# Patient Record
Sex: Female | Born: 1955 | Race: Black or African American | Hispanic: No | Marital: Single | Smoking: Former smoker
Health system: Southern US, Community
[De-identification: ages and names within clinical notes are randomized; demographics above are authoritative.]

## PROBLEM LIST (undated history)

## (undated) ENCOUNTER — Ambulatory Visit: Admission: EM | Payer: Medicare PPO | Source: Home / Self Care

## (undated) DIAGNOSIS — Z8619 Personal history of other infectious and parasitic diseases: Secondary | ICD-10-CM

## (undated) DIAGNOSIS — K219 Gastro-esophageal reflux disease without esophagitis: Secondary | ICD-10-CM

## (undated) DIAGNOSIS — E785 Hyperlipidemia, unspecified: Secondary | ICD-10-CM

## (undated) DIAGNOSIS — R569 Unspecified convulsions: Secondary | ICD-10-CM

## (undated) DIAGNOSIS — I10 Essential (primary) hypertension: Secondary | ICD-10-CM

## (undated) DIAGNOSIS — E0500 Thyrotoxicosis with diffuse goiter without thyrotoxic crisis or storm: Secondary | ICD-10-CM

## (undated) DIAGNOSIS — E11319 Type 2 diabetes mellitus with unspecified diabetic retinopathy without macular edema: Secondary | ICD-10-CM

## (undated) DIAGNOSIS — E119 Type 2 diabetes mellitus without complications: Secondary | ICD-10-CM

## (undated) HISTORY — DX: Type 2 diabetes mellitus without complications: E11.9

## (undated) HISTORY — DX: Personal history of other infectious and parasitic diseases: Z86.19

## (undated) HISTORY — DX: Essential (primary) hypertension: I10

## (undated) HISTORY — DX: Thyrotoxicosis with diffuse goiter without thyrotoxic crisis or storm: E05.00

## (undated) HISTORY — DX: Gastro-esophageal reflux disease without esophagitis: K21.9

## (undated) HISTORY — DX: Type 2 diabetes mellitus with unspecified diabetic retinopathy without macular edema: E11.319

## (undated) HISTORY — PX: BUNIONECTOMY: SHX129

## (undated) HISTORY — DX: Hyperlipidemia, unspecified: E78.5

## (undated) HISTORY — DX: Unspecified convulsions: R56.9

## (undated) HISTORY — PX: OTHER SURGICAL HISTORY: SHX169

---

## 2000-03-14 ENCOUNTER — Other Ambulatory Visit: Admission: RE | Admit: 2000-03-14 | Discharge: 2000-03-14 | Payer: Self-pay | Admitting: Obstetrics & Gynecology

## 2000-12-23 ENCOUNTER — Encounter: Admission: RE | Admit: 2000-12-23 | Discharge: 2001-03-23 | Payer: Self-pay | Admitting: Internal Medicine

## 2001-05-18 ENCOUNTER — Other Ambulatory Visit: Admission: RE | Admit: 2001-05-18 | Discharge: 2001-05-18 | Payer: Self-pay | Admitting: Obstetrics & Gynecology

## 2001-05-22 ENCOUNTER — Encounter: Admission: RE | Admit: 2001-05-22 | Discharge: 2001-05-22 | Payer: Self-pay | Admitting: Gastroenterology

## 2001-05-22 ENCOUNTER — Encounter: Payer: Self-pay | Admitting: Family Medicine

## 2001-11-09 ENCOUNTER — Encounter: Payer: Self-pay | Admitting: Obstetrics & Gynecology

## 2001-11-09 ENCOUNTER — Encounter: Admission: RE | Admit: 2001-11-09 | Discharge: 2001-11-09 | Payer: Self-pay | Admitting: Obstetrics & Gynecology

## 2002-07-01 ENCOUNTER — Encounter: Admission: RE | Admit: 2002-07-01 | Discharge: 2002-07-01 | Payer: Self-pay | Admitting: Obstetrics & Gynecology

## 2002-07-01 ENCOUNTER — Encounter: Payer: Self-pay | Admitting: Obstetrics & Gynecology

## 2002-07-19 ENCOUNTER — Other Ambulatory Visit: Admission: RE | Admit: 2002-07-19 | Discharge: 2002-07-19 | Payer: Self-pay | Admitting: Obstetrics & Gynecology

## 2003-06-07 ENCOUNTER — Other Ambulatory Visit: Admission: RE | Admit: 2003-06-07 | Discharge: 2003-06-07 | Payer: Self-pay | Admitting: Obstetrics & Gynecology

## 2003-06-14 ENCOUNTER — Encounter: Admission: RE | Admit: 2003-06-14 | Discharge: 2003-06-14 | Payer: Self-pay | Admitting: Obstetrics & Gynecology

## 2004-04-06 ENCOUNTER — Other Ambulatory Visit: Admission: RE | Admit: 2004-04-06 | Discharge: 2004-04-06 | Payer: Self-pay | Admitting: Obstetrics & Gynecology

## 2004-07-26 ENCOUNTER — Encounter: Admission: RE | Admit: 2004-07-26 | Discharge: 2004-07-26 | Payer: Self-pay | Admitting: Obstetrics & Gynecology

## 2004-08-07 ENCOUNTER — Encounter: Admission: RE | Admit: 2004-08-07 | Discharge: 2004-08-07 | Payer: Self-pay | Admitting: Obstetrics & Gynecology

## 2005-10-10 ENCOUNTER — Encounter: Admission: RE | Admit: 2005-10-10 | Discharge: 2005-10-10 | Payer: Self-pay | Admitting: Family Medicine

## 2006-05-27 HISTORY — PX: COLONOSCOPY: SHX174

## 2006-07-18 ENCOUNTER — Ambulatory Visit (HOSPITAL_COMMUNITY): Admission: RE | Admit: 2006-07-18 | Discharge: 2006-07-18 | Payer: Self-pay | Admitting: *Deleted

## 2006-10-29 ENCOUNTER — Encounter: Admission: RE | Admit: 2006-10-29 | Discharge: 2006-10-29 | Payer: Self-pay | Admitting: Obstetrics & Gynecology

## 2007-08-16 ENCOUNTER — Emergency Department (HOSPITAL_COMMUNITY): Admission: EM | Admit: 2007-08-16 | Discharge: 2007-08-16 | Payer: Self-pay | Admitting: Emergency Medicine

## 2007-11-30 ENCOUNTER — Encounter: Admission: RE | Admit: 2007-11-30 | Discharge: 2007-11-30 | Payer: Self-pay | Admitting: Obstetrics & Gynecology

## 2009-01-24 ENCOUNTER — Encounter: Admission: RE | Admit: 2009-01-24 | Discharge: 2009-01-24 | Payer: Self-pay | Admitting: Obstetrics & Gynecology

## 2009-03-24 IMAGING — MG MM SCREEN MAMMOGRAM BILATERAL
4 series · 4 of 4 positions shown · non-contrast
Comparison: none

DG SCREEN MAMMOGRAM BILATERAL
Bilateral CC and MLO view(s) were taken.

DIGITAL SCREENING MAMMOGRAM WITH CAD:
There is a  dense fibroglandular pattern.  No masses or malignant type calcifications are 
identified.  Compared with prior studies.

[R CC]
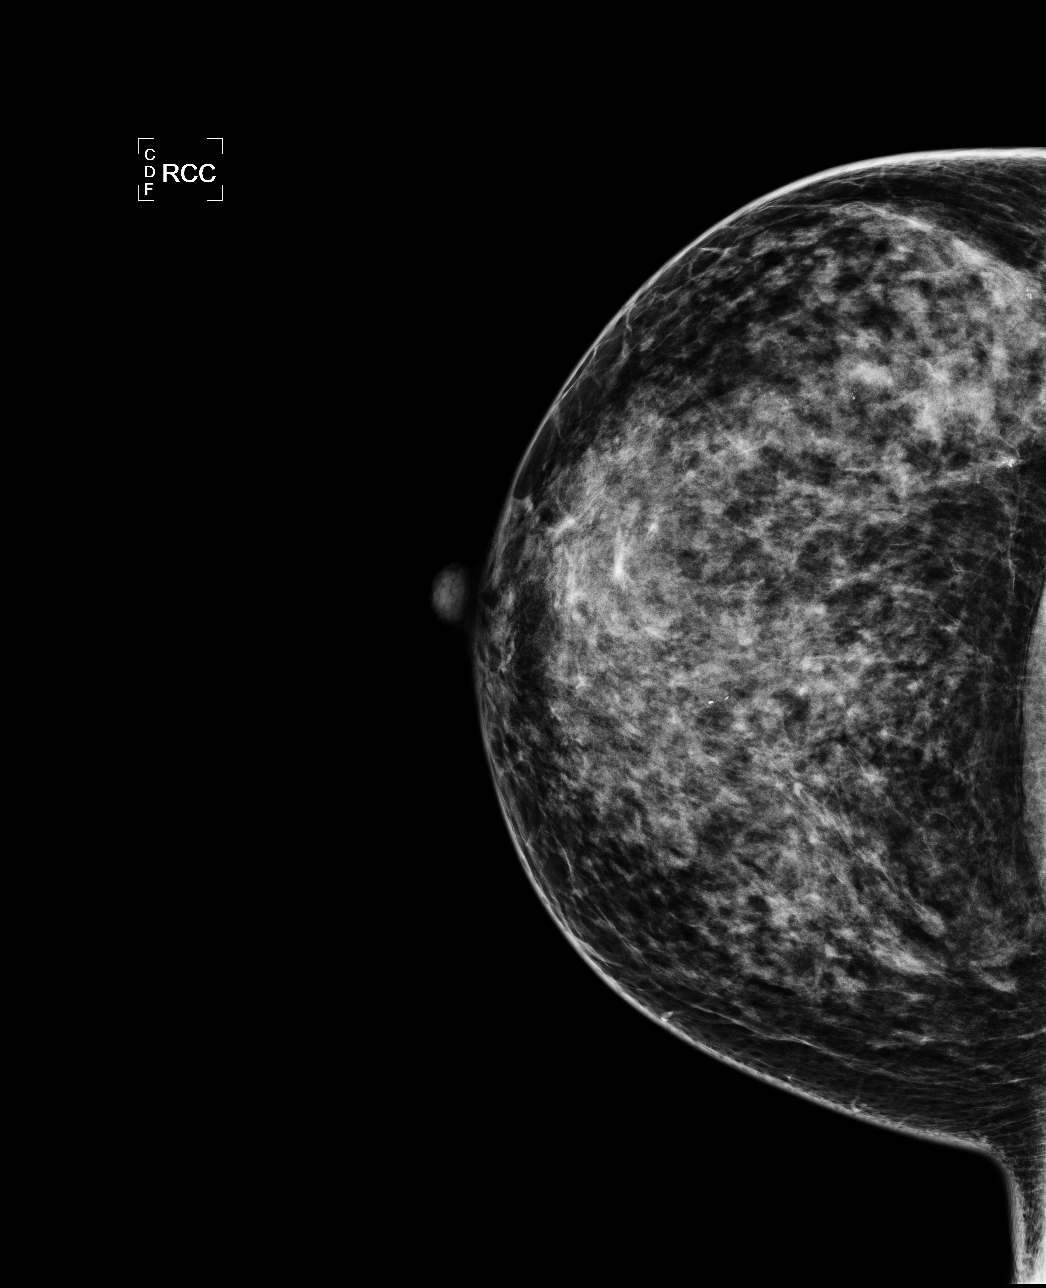

[L CC]
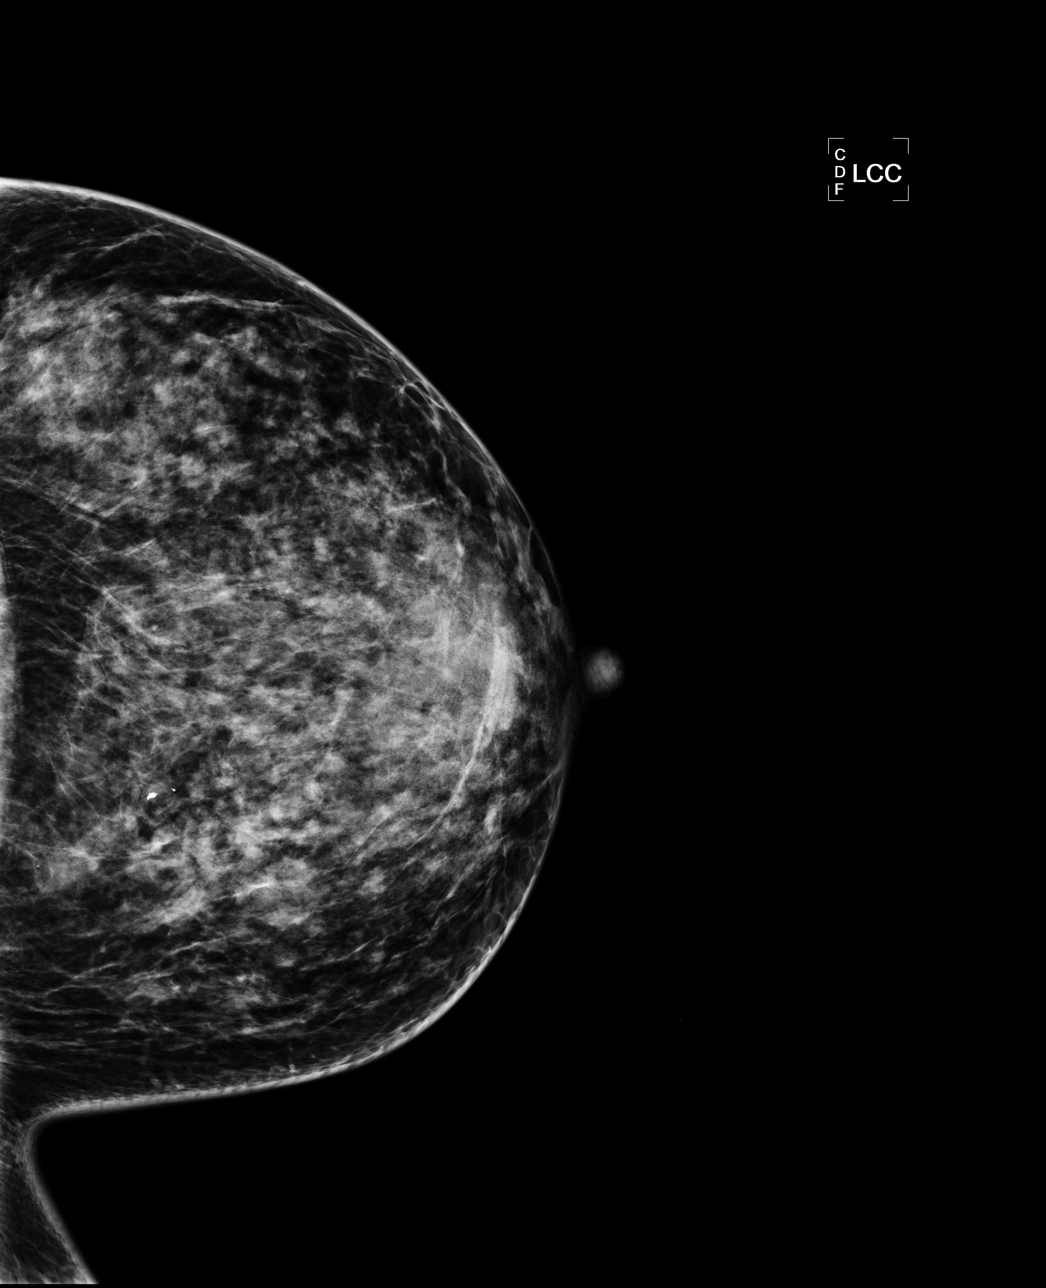

[L MLO]
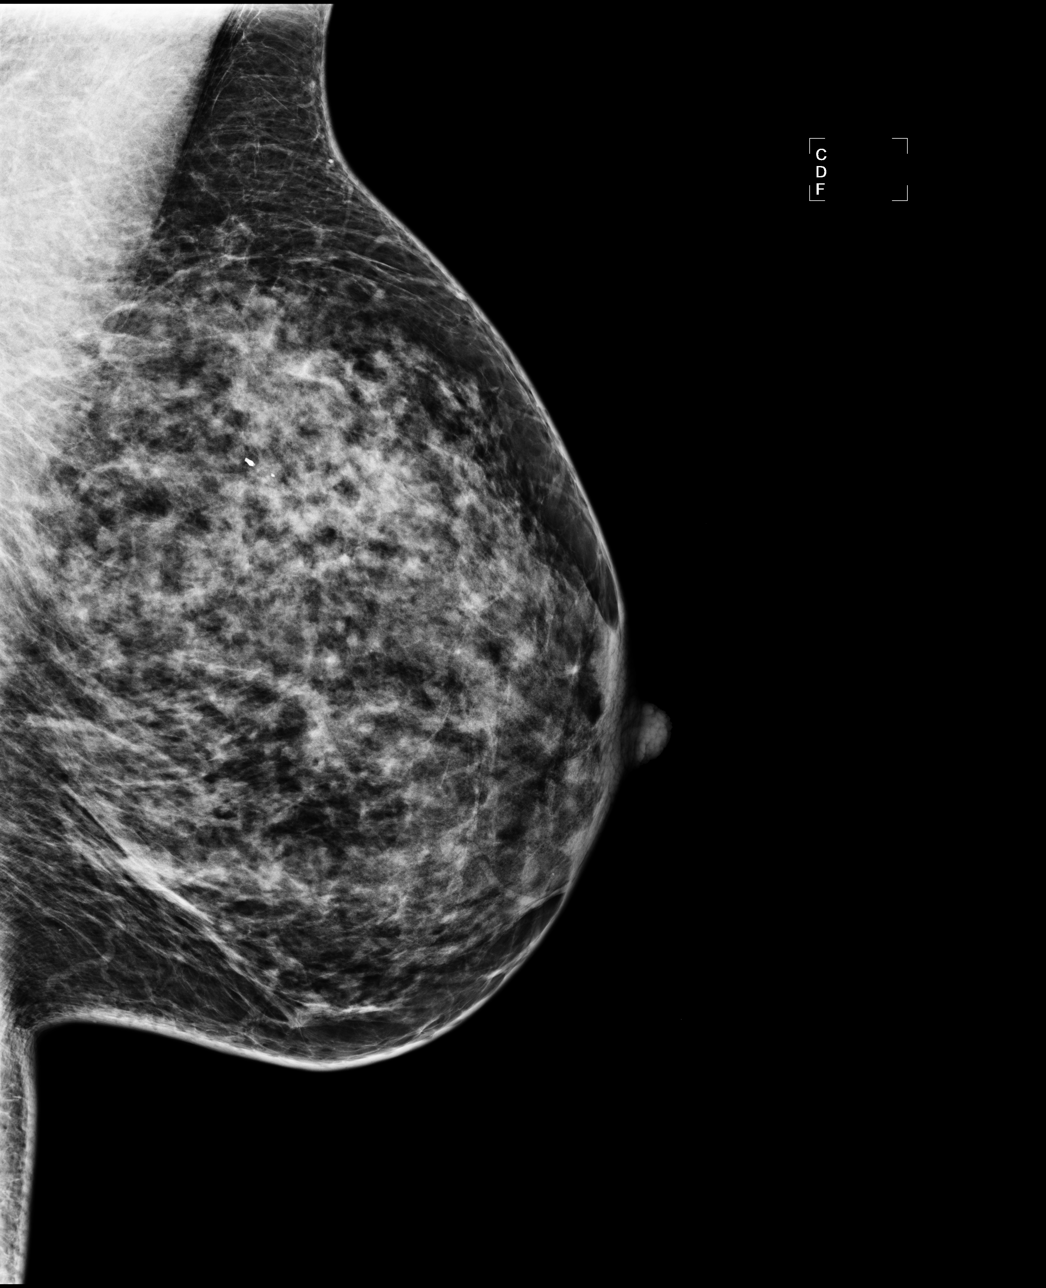

[R MLO]
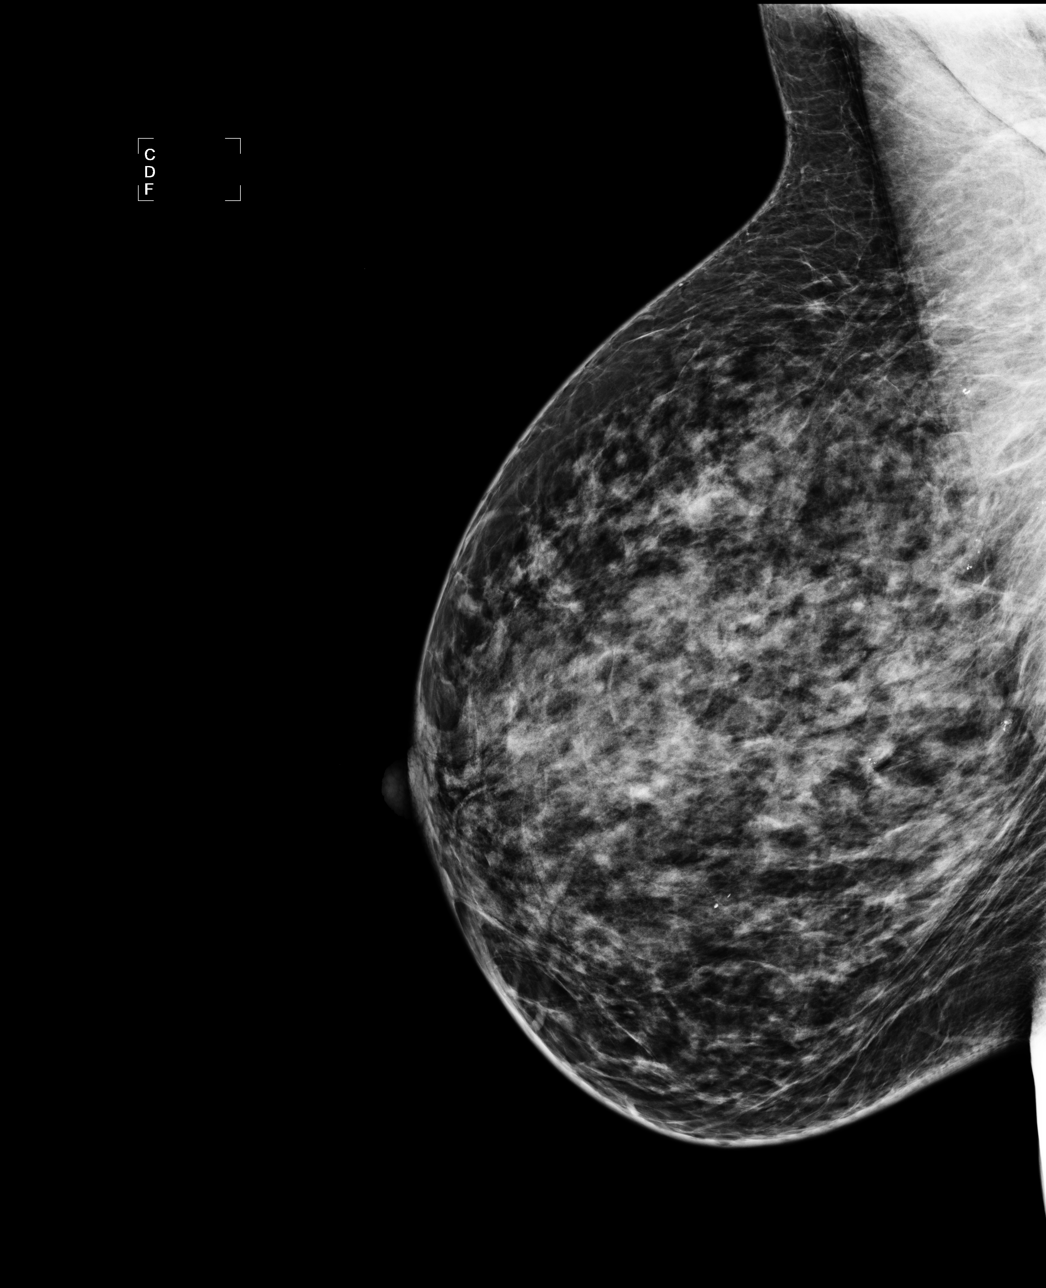

[4 of 4 positions shown; findings below may reference images not displayed]

IMPRESSION: No specific mammographic evidence of malignancy.  Next screening mammogram is recommended in one 
year. Consider biennial screening breast MRI given the patient's high risk status with a strong 
family history of breast cancer.

ASSESSMENT: Negative - BI-RADS 1

Screening mammogram in 1 year.
ANALYZED BY COMPUTER AIDED DETECTION. , THIS PROCEDURE WAS A DIGITAL MAMMOGRAM.

## 2009-09-29 ENCOUNTER — Emergency Department (HOSPITAL_COMMUNITY): Admission: EM | Admit: 2009-09-29 | Discharge: 2009-09-29 | Payer: Self-pay | Admitting: Emergency Medicine

## 2010-03-06 ENCOUNTER — Encounter: Admission: RE | Admit: 2010-03-06 | Discharge: 2010-03-06 | Payer: Self-pay | Admitting: Obstetrics & Gynecology

## 2010-06-17 ENCOUNTER — Encounter: Payer: Self-pay | Admitting: Obstetrics & Gynecology

## 2010-08-14 LAB — DIFFERENTIAL
Basophils Relative: 1 % (ref 0–1)
Lymphocytes Relative: 26 % (ref 12–46)
Monocytes Absolute: 0.6 10*3/uL (ref 0.1–1.0)
Monocytes Relative: 10 % (ref 3–12)
Neutro Abs: 3.9 10*3/uL (ref 1.7–7.7)

## 2010-08-14 LAB — COMPREHENSIVE METABOLIC PANEL
ALT: 22 U/L (ref 0–35)
Albumin: 4 g/dL (ref 3.5–5.2)
Alkaline Phosphatase: 65 U/L (ref 39–117)
BUN: 24 mg/dL — ABNORMAL HIGH (ref 6–23)
Chloride: 107 mEq/L (ref 96–112)
GFR calc Af Amer: >60 mL/min (ref >60)
Glucose, Bld: 69 mg/dL — ABNORMAL LOW (ref 70–99)
Potassium: 3.1 mEq/L — ABNORMAL LOW (ref 3.5–5.1)
Total Bilirubin: 1.1 mg/dL (ref 0.3–1.2)

## 2010-08-14 LAB — URINE MICROSCOPIC-ADD ON

## 2010-08-14 LAB — CBC
MCHC: 32.5 g/dL (ref 30.0–36.0)
MCV: 94.1 fL (ref 78.0–100.0)
Platelets: 171 10*3/uL (ref 150–400)

## 2010-08-14 LAB — URINALYSIS, ROUTINE W REFLEX MICROSCOPIC
Bilirubin Urine: NEGATIVE
Glucose, UA: >1000 mg/dL — AB
Specific Gravity, Urine: 1.029 (ref 1.005–1.030)
pH: 6 (ref 5.0–8.0)

## 2010-08-14 LAB — GLUCOSE, CAPILLARY: Glucose-Capillary: 133 mg/dL — ABNORMAL HIGH (ref 70–99)

## 2010-10-12 NOTE — Op Note (Signed)
NAMEPERCILLA, TWETEN NO.:  [id number]   MEDICAL RECORD NO.:  [id number]          PATIENT TYPE:  AMB   LOCATION:  ENDO                         FACILITY:  [facility]   PHYSICIAN:  Georgiana Spinner, M.D.    DATE OF BIRTH:  06-Nov-1955   DATE OF PROCEDURE:  07/18/2006  DATE OF DISCHARGE:                               OPERATIVE REPORT   PROCEDURE:  Colonoscopy.   INDICATIONS:  Rectal bleeding, colon cancer screening.   ANESTHESIA:  Demerol 70 and Versed 7.5 mg.   PROCEDURE:  With the patient mildly sedated in the left lateral  decubitus position, the Pentax videoscopic colonoscope was inserted in  the rectum and, with pressure applied, we advanced under direct vision  to the cecum identified by ileocecal valve and appendiceal orifice, both  of which were photographed.  From this point the colonoscope was slowly  withdrawn taking circumferential views of the colonic mucosa, stopping  in the rectum which appeared normal on direct and showed hemorrhoids on  retroflexed view.  The endoscope was straightened and withdrawn.  The  patient's vital signs and pulse oximeter remained stable.  The patient  tolerated procedure well without apparent complications.   FINDINGS:  Internal hemorrhoids, otherwise an unremarkable examination.   PLAN:  Consider repeat examination in 5-10 years           ______________________________  Georgiana Spinner, M.D.     GMO/MEDQ  D:  07/18/2006  T:  07/18/2006  Job:  [id number]

## 2010-11-12 ENCOUNTER — Other Ambulatory Visit (HOSPITAL_COMMUNITY)
Admission: RE | Admit: 2010-11-12 | Discharge: 2010-11-12 | Disposition: A | Discharge status: Home / Self Care | Payer: BC Managed Care – PPO | Source: Ambulatory Visit | Attending: Internal Medicine | Admitting: Internal Medicine

## 2010-11-12 DIAGNOSIS — Z01419 Encounter for gynecological examination (general) (routine) without abnormal findings: Secondary | ICD-10-CM | POA: Insufficient documentation

## 2011-02-18 LAB — URINALYSIS, ROUTINE W REFLEX MICROSCOPIC
Bilirubin Urine: NEGATIVE
Glucose, UA: >1000 — AB
Hgb urine dipstick: NEGATIVE
Ketones, ur: NEGATIVE
Protein, ur: NEGATIVE
Urobilinogen, UA: 1
pH: 6.5

## 2011-02-18 LAB — DIFFERENTIAL
Basophils Relative: 0
Eosinophils Absolute: 0
Eosinophils Relative: 1
Lymphs Abs: 2.1
Monocytes Absolute: 0.4
Monocytes Relative: 7
Neutrophils Relative %: 54

## 2011-02-18 LAB — CBC
HCT: 33.3 — ABNORMAL LOW
Hemoglobin: 11.3 — ABNORMAL LOW
MCV: 91.9
Platelets: 153
RBC: 3.62 — ABNORMAL LOW

## 2011-02-18 LAB — POCT I-STAT, CHEM 8
BUN: 28 — ABNORMAL HIGH
Calcium, Ion: 1.12
Chloride: 99
HCT: 36
Sodium: 134 — ABNORMAL LOW

## 2011-09-27 ENCOUNTER — Other Ambulatory Visit: Payer: Self-pay | Admitting: Obstetrics & Gynecology

## 2011-12-31 ENCOUNTER — Encounter: Payer: Self-pay | Admitting: Gastroenterology

## 2012-01-31 ENCOUNTER — Ambulatory Visit (AMBULATORY_SURGERY_CENTER): Payer: BC Managed Care – PPO | Admitting: *Deleted

## 2012-01-31 VITALS — Ht 66.0 in | Wt 204.6 lb

## 2012-01-31 DIAGNOSIS — Z1211 Encounter for screening for malignant neoplasm of colon: Secondary | ICD-10-CM

## 2012-01-31 NOTE — Progress Notes (Signed)
Pt had colonoscopy with Dr. Virginia Rochester 2008.  Report forwarded to Dr. Arlyce Dice for review.

## 2012-02-03 ENCOUNTER — Telehealth: Payer: Self-pay | Admitting: *Deleted

## 2012-02-03 ENCOUNTER — Encounter: Payer: Self-pay | Admitting: Gastroenterology

## 2012-02-03 NOTE — Telephone Encounter (Signed)
Patient's last colonoscopy was with Dr.Orr in 06/2006 with normal results. No family history and no GI symptoms. Dr.Kaplan notified,he states her recall should be due in 2018 not now. Patient made aware,left message on her home #. Cancelled colonoscopy and recall placed in epic for 06/2016. Sherren Kerns

## 2012-02-13 ENCOUNTER — Emergency Department (HOSPITAL_COMMUNITY): Payer: BC Managed Care – PPO

## 2012-02-13 ENCOUNTER — Encounter (HOSPITAL_COMMUNITY): Payer: Self-pay | Admitting: Emergency Medicine

## 2012-02-13 ENCOUNTER — Emergency Department (HOSPITAL_COMMUNITY)
Admission: EM | Admit: 2012-02-13 | Discharge: 2012-02-13 | Disposition: A | Discharge status: Home / Self Care | Payer: BC Managed Care – PPO | Attending: Emergency Medicine | Admitting: Emergency Medicine

## 2012-02-13 DIAGNOSIS — M79609 Pain in unspecified limb: Secondary | ICD-10-CM | POA: Insufficient documentation

## 2012-02-13 DIAGNOSIS — M549 Dorsalgia, unspecified: Secondary | ICD-10-CM | POA: Insufficient documentation

## 2012-02-13 DIAGNOSIS — M25519 Pain in unspecified shoulder: Secondary | ICD-10-CM

## 2012-02-13 MED ORDER — KETOROLAC TROMETHAMINE 30 MG/ML IJ SOLN
30.0000 mg | Freq: Once | INTRAMUSCULAR | Status: AC
  Administered 2012-02-13: 30 mg via INTRAMUSCULAR
  Filled 2012-02-13: qty 1

## 2012-02-13 NOTE — ED Notes (Signed)
Pt reports increased pain r/shoulder and arm pain x 12 hrs.. Pain started 3 has after lifting weights at gym. Denies trauma. Pain responded to naproxen last night

## 2012-02-13 NOTE — ED Notes (Signed)
Pt escorted to discharge window. Pt verbalized understanding d/c instructions. Pt in no acute distress. Pt provided with wheelchair to discharge window upon pt request

## 2012-02-13 NOTE — ED Notes (Signed)
Pt back from x-ray.

## 2012-02-13 NOTE — ED Notes (Signed)
Pt alert and oriented x4. Respirations even and unlabored, bilateral symmetrical rise and fall of chest. Skin warm and dry. In no acute distress. Denies needs.   

## 2012-02-13 NOTE — ED Notes (Signed)
md at bedside  Pt alert and oriented x4. Respirations even and unlabored, bilateral symmetrical rise and fall of chest. Skin warm and dry. In no acute distress. Denies needs.   

## 2012-02-13 NOTE — ED Provider Notes (Signed)
History     CSN: [id number]  Arrival date & time 02/13/12  1106   First MD Initiated Contact with Patient 02/13/12 1127      Chief Complaint  Patient presents with  . Shoulder Pain    pain in shoulder and back x 12 hrs     HPI  The patient presents with shoulder pain.  She notes the pain began in her shoulder approximately 12 hours ago.  She notes that the pain began after an exercise session involved upper extremity weight lifting.  Since onset of pain focally about the proximal right upper extremity anteriorly.  The pain is sore, worse with motion.  She denies distal dysesthesia or weakness.  She denies any right-sided chest or left-sided chest complaints.  She denies any fevers, chills, cough, with focal complaints.  She notes minimal relief with OTC medication  Past Medical History  Diagnosis Date  . Diabetes mellitus   . Hypertension     Past Surgical History  Procedure Date  . No prior surgery   . Colonoscopy 2008    internal hemorrhoids; otherwise normal.  Dr Virginia Rochester    Family History  Problem Relation Age of Onset  . Colon cancer Neg Hx   . Stomach cancer Neg Hx   . Diabetes Mother   . Hypertension Mother     History  Substance Use Topics  . Smoking status: Former Smoker    Quit date: 01/31/1979  . Smokeless tobacco: Never Used  . Alcohol Use: Yes     occasional    OB History    Grav Para Term Preterm Abortions TAB SAB Ect Mult Living                  Review of Systems  Constitutional:       HPI  HENT:       HPI otherwise negative  Eyes: Negative.   Respiratory:       HPI, otherwise negative  Cardiovascular:       HPI, otherwise nmegative  Gastrointestinal: Negative for vomiting.  Genitourinary:       HPI, otherwise negative  Musculoskeletal:       HPI, otherwise negative  Skin: Negative.   Neurological: Negative for syncope.    Allergies  Review of patient's allergies indicates no known allergies.  Home Medications   Current  Outpatient Rx  Name Route Sig Dispense Refill  . AMLODIPINE BESY-BENAZEPRIL HCL 5-10 MG PO CAPS Oral Take 1 capsule by mouth daily.     . APIDRA SOLOSTAR 100 UNIT/ML [place] SOLN Subcutaneous Inject 7 Units into the skin as needed. Sliding scale as needed    . BD PEN NEEDLE SHORT U/F 31G X 8 MM MISC      . GARCINIA CAMBOGIA-CHROMIUM PO Oral Take 2 capsules by mouth daily.    Marland Kitchen LANTUS SOLOSTAR 100 UNIT/ML [place] SOLN Subcutaneous Inject 45 Units into the skin at bedtime.     . COLON CLEANSER PO Oral Take by mouth daily.    Marland Kitchen ONE-DAILY MULTI VITAMINS PO TABS Oral Take 1 tablet by mouth daily.    Marland Kitchen NAPROXEN 500 MG PO TABS Oral Take 500 mg by mouth as needed.     Letta Pate ULTRA BLUE VI STRP        BP 178/79  Pulse 91  Temp 97.8 F (36.6 C) (Oral)  Resp 22  SpO2 93%  Physical Exam  Nursing note and vitals reviewed. Constitutional: She is oriented to person, place, and time.  She appears well-developed and well-nourished. No distress.  HENT:  Head: Normocephalic and atraumatic.  Eyes: Conjunctivae normal and EOM are normal.  Cardiovascular: Normal rate and regular rhythm.   Pulmonary/Chest: Effort normal and breath sounds normal. No stridor. No respiratory distress.  Abdominal: She exhibits no distension.  Musculoskeletal: She exhibits no edema.       The patient has an inconsistent tenderness to palpation about the right bicep, with no discernible deformity or any overlying erythema, or palpable edema.  The patient has range of motion that is appropriate in all dimensions of the elbow with shoulder restricted to 90 elevation.  Strength is 5/5 in all dimensions.  Neurological: She is alert and oriented to person, place, and time. No cranial nerve deficit.  Skin: Skin is warm and dry.  Psychiatric: She has a normal mood and affect.    ED Course  Procedures (including critical care time)  Labs Reviewed - No data to display Dg Shoulder Right  02/13/2012  *RADIOLOGY REPORT*  Clinical Data:  Right shoulder pain.  No known injury.  RIGHT SHOULDER - 2+ VIEW  Comparison: None.  Findings: There is no fracture or dislocation.  Glenohumeral joint appears normal.  Slight degenerative changes of the acromioclavicular joint.  IMPRESSION: Slight degenerative changes of the acromioclavicular joint. Otherwise normal exam.   Original Report Authenticated By: Gwynn Burly, M.D.      No diagnosis found.    MDM  The patient presents with right upper extremity pain.  Description of pain that began after an exercise session, the absence of distress, the generally reassuring physical exam and an x-ray demonstrated no fracture is all suggestive of musculoskeletal etiology, likely sprain versus strain.  Absent acute deficits, the patient is appropriate for discharged with a course of anti-inflammatories, orthopedics followup.   Gerhard Munch, MD 02/13/12 1316

## 2012-02-13 NOTE — ED Notes (Signed)
Pt has multiple ice packs applied to right arm and side

## 2012-02-21 ENCOUNTER — Encounter: Payer: BC Managed Care – PPO | Admitting: Gastroenterology

## 2013-05-29 ENCOUNTER — Ambulatory Visit (INDEPENDENT_AMBULATORY_CARE_PROVIDER_SITE_OTHER): Payer: BC Managed Care – PPO | Admitting: Family Medicine

## 2013-05-29 ENCOUNTER — Ambulatory Visit: Payer: BC Managed Care – PPO

## 2013-05-29 VITALS — BP 132/84 | HR 66 | Temp 98.5°F | Resp 16 | Ht 65.5 in | Wt 203.0 lb

## 2013-05-29 DIAGNOSIS — S8000XA Contusion of unspecified knee, initial encounter: Secondary | ICD-10-CM

## 2013-05-29 DIAGNOSIS — R0781 Pleurodynia: Secondary | ICD-10-CM

## 2013-05-29 DIAGNOSIS — M25561 Pain in right knee: Secondary | ICD-10-CM

## 2013-05-29 DIAGNOSIS — R079 Chest pain, unspecified: Secondary | ICD-10-CM

## 2013-05-29 DIAGNOSIS — S20219A Contusion of unspecified front wall of thorax, initial encounter: Secondary | ICD-10-CM

## 2013-05-29 DIAGNOSIS — M25569 Pain in unspecified knee: Secondary | ICD-10-CM

## 2013-05-29 DIAGNOSIS — S20212A Contusion of left front wall of thorax, initial encounter: Secondary | ICD-10-CM

## 2013-05-29 DIAGNOSIS — S8001XA Contusion of right knee, initial encounter: Secondary | ICD-10-CM

## 2013-05-29 NOTE — Patient Instructions (Signed)
Take Tylenol or ibuprofen for the pain.  This should resolve with time, but if you're not improving over the next couple of weeks please return

## 2013-05-29 NOTE — Progress Notes (Signed)
Subjective: A week ago the patient tripped and fell forward landing hard on her left side. She landed her right knee also. The knee had some abrasions and continues to hurt the left hand had abrasions which healed up, as did the forearm. She keeps on hurting in her left chest wall just below the breast when she moves or reaches or lays on it.  Objective: Alert and oriented. Arm has good range of motion. Chest is clear. Heart regular without murmurs. Abdomen soft without masses or tenderness. Normal bowel sounds. Mild left chest wall tenderness just below the breast. Right knee had some deep abrasions which are healing up. She still tender with possible part of the tibia.  [facility] reading (PRIMARY) by  Dr. Alwyn RenHopper No rib or knee fracture noted  Assessment: Contusion left ribs Contusion right knee  Plan: Symptomatic treatment.

## 2013-05-31 ENCOUNTER — Other Ambulatory Visit: Payer: Self-pay

## 2013-05-31 DIAGNOSIS — Z1231 Encounter for screening mammogram for malignant neoplasm of breast: Secondary | ICD-10-CM

## 2013-06-14 ENCOUNTER — Ambulatory Visit
Admission: RE | Admit: 2013-06-14 | Discharge: 2013-06-14 | Disposition: A | Discharge status: Home / Self Care | Payer: BC Managed Care – PPO | Source: Ambulatory Visit

## 2013-06-14 DIAGNOSIS — Z1231 Encounter for screening mammogram for malignant neoplasm of breast: Secondary | ICD-10-CM

## 2015-05-06 ENCOUNTER — Emergency Department (HOSPITAL_COMMUNITY)
Admission: EM | Admit: 2015-05-06 | Discharge: 2015-05-06 | Disposition: A | Discharge status: Home / Self Care | Payer: BC Managed Care – PPO | Attending: Emergency Medicine | Admitting: Emergency Medicine

## 2015-05-06 ENCOUNTER — Encounter (HOSPITAL_COMMUNITY): Payer: Self-pay | Admitting: *Deleted

## 2015-05-06 DIAGNOSIS — E119 Type 2 diabetes mellitus without complications: Secondary | ICD-10-CM | POA: Diagnosis not present

## 2015-05-06 DIAGNOSIS — J069 Acute upper respiratory infection, unspecified: Secondary | ICD-10-CM | POA: Diagnosis not present

## 2015-05-06 DIAGNOSIS — M542 Cervicalgia: Secondary | ICD-10-CM | POA: Insufficient documentation

## 2015-05-06 DIAGNOSIS — Z794 Long term (current) use of insulin: Secondary | ICD-10-CM | POA: Diagnosis not present

## 2015-05-06 DIAGNOSIS — J0110 Acute frontal sinusitis, unspecified: Secondary | ICD-10-CM | POA: Diagnosis not present

## 2015-05-06 DIAGNOSIS — I10 Essential (primary) hypertension: Secondary | ICD-10-CM | POA: Insufficient documentation

## 2015-05-06 DIAGNOSIS — Z87891 Personal history of nicotine dependence: Secondary | ICD-10-CM | POA: Diagnosis not present

## 2015-05-06 DIAGNOSIS — Z79899 Other long term (current) drug therapy: Secondary | ICD-10-CM | POA: Diagnosis not present

## 2015-05-06 DIAGNOSIS — R0981 Nasal congestion: Secondary | ICD-10-CM

## 2015-05-06 NOTE — ED Notes (Signed)
Pt states that she has had neck pain / stiffness and feeling sluggish over the last 2 days; pt states that she has taken her BP and noticed it to be elevated; pt reports 190 sys at home prior to arrival; pt c/o feeling full in her head

## 2015-05-06 NOTE — Discharge Instructions (Signed)
Continue to stay well-hydrated. Continue to alternate between Tylenol and Ibuprofen for pain or fever. Use Mucinex (without decongestants) for cough suppression/expectoration of mucus. Use netipot and over the counter flonase to help with nasal congestion. May consider over-the-counter Benadryl or other antihistamine to decrease secretions and for watery itchy eyes. Avoid decongestant medications aside from Coricidin HBP. Avoid salt intake, avoid caffeine intake. Followup with your primary care doctor in 2-3 days for recheck of ongoing symptoms and recheck of blood pressure. Return to emergency department for emergent changing or worsening of symptoms.   Hypertension Hypertension is another name for high blood pressure. High blood pressure forces your heart to work harder to pump blood. A blood pressure reading has two numbers, which includes a higher number over a lower number (example: 110/72). HOME CARE   Have your blood pressure rechecked by your doctor.  Only take medicine as told by your doctor. Follow the directions carefully. The medicine does not work as well if you skip doses. Skipping doses also puts you at risk for problems.  Do not smoke.  Monitor your blood pressure at home as told by your doctor. GET HELP IF:  You think you are having a reaction to the medicine you are taking.  You have repeat headaches or feel dizzy.  You have puffiness (swelling) in your ankles.  You have trouble with your vision. GET HELP RIGHT AWAY IF:   You get a very bad headache and are confused.  You feel weak, numb, or faint.  You get chest or belly (abdominal) pain.  You throw up (vomit).  You cannot breathe very well. MAKE SURE YOU:   Understand these instructions.  Will watch your condition.  Will get help right away if you are not doing well or get worse.   This information is not intended to replace advice given to you by your health care provider. Make sure you discuss any  questions you have with your health care provider.   Document Released: 10/30/2007 Document Revised: 05/18/2013 Document Reviewed: 03/05/2013 Elsevier Interactive Patient Education 2016 [employer].  How to Take Your Blood Pressure HOW DO I GET A BLOOD PRESSURE MACHINE?  You can buy an electronic home blood pressure machine at your local pharmacy. Insurance will sometimes cover the cost if you have a prescription.  Ask your doctor what type of machine is best for you. There are different machines for your arm and your wrist.  If you decide to buy a machine to check your blood pressure on your arm, first check the size of your arm so you can buy the right size cuff. To check the size of your arm:   Use a measuring tape that shows both inches and centimeters.   Wrap the measuring tape around the upper-middle part of your arm. You may need someone to help you measure.   Write down your arm measurement in both inches and centimeters.   To measure your blood pressure correctly, it is important to have the right size cuff.   If your arm is up to 13 inches (up to 34 centimeters), get an adult cuff size.  If your arm is 13 to 17 inches (35 to 44 centimeters), get a large adult cuff size.    If your arm is 17 to 20 inches (45 to 52 centimeters), get an adult thigh cuff.  WHAT DO THE NUMBERS MEAN?   There are two numbers that make up your blood pressure. For example: 120/80.  The first  number (120 in our example) is called the "systolic pressure." It is a measure of the pressure in your blood vessels when your heart is pumping blood.  The second number (80 in our example) is called the "diastolic pressure." It is a measure of the pressure in your blood vessels when your heart is resting between beats.  Your doctor will tell you what your blood pressure should be. WHAT SHOULD I DO BEFORE I CHECK MY BLOOD PRESSURE?   Try to rest or relax for at least 30 minutes before you check  your blood pressure.  Do not smoke.  Do not have any drinks with caffeine, such as:  Soda.  Coffee.  Tea.  Check your blood pressure in a quiet room.  Sit down and stretch out your arm on a table. Keep your arm at about the level of your heart. Let your arm relax.  Make sure that your legs are not crossed. HOW DO I CHECK MY BLOOD PRESSURE?  Follow the directions that came with your machine.  Make sure you remove any tight-fitting clothing from your arm or wrist. Wrap the cuff around your upper arm or wrist. You should be able to fit a finger between the cuff and your arm. If you cannot fit a finger between the cuff and your arm, it is too tight and should be removed and rewrapped.  Some units require you to manually pump up the arm cuff.  Automatic units inflate the cuff when you press a button.  Cuff deflation is automatic in both models.  After the cuff is inflated, the unit measures your blood pressure and pulse. The readings are shown on a monitor. Hold still and breathe normally while the cuff is inflated.  Getting a reading takes less than a minute.  Some models store readings in a memory. Some provide a printout of readings. If your machine does not store your readings, keep a written record.  Take readings with you to your next visit with your doctor.   This information is not intended to replace advice given to you by your health care provider. Make sure you discuss any questions you have with your health care provider.   Document Released: 04/25/2008 Document Revised: 06/03/2014 Document Reviewed: 07/08/2013 Elsevier Interactive Patient Education 2016 [employer] Inc[employer]  DASH Eating Plan DASH stands for "Dietary Approaches to Stop Hypertension." The DASH eating plan is a healthy eating plan that has been shown to reduce high blood pressure (hypertension). Additional health benefits may include reducing the risk of type 2 diabetes mellitus, heart disease, and stroke.  The DASH eating plan may also help with weight loss. WHAT DO I NEED TO KNOW ABOUT THE DASH EATING PLAN? For the DASH eating plan, you will follow these general guidelines:  Choose foods with a percent daily value for sodium of less than 5% (as listed on the food label).  Use salt-free seasonings or herbs instead of table salt or sea salt.  Check with your health care provider or pharmacist before using salt substitutes.  Eat lower-sodium products, often labeled as "lower sodium" or "no salt added."  Eat fresh foods.  Eat more vegetables, fruits, and low-fat dairy products.  Choose whole grains. Look for the word "whole" as the first word in the ingredient list.  Choose fish and skinless chicken or [place] more often than red meat. Limit fish, poultry, and meat to 6 oz (170 g) each day.  Limit sweets, desserts, sugars, and sugary drinks.  Choose heart-healthy  fats.  Limit cheese to 1 oz (28 g) per day.  Eat more home-cooked food and less restaurant, buffet, and fast food.  Limit fried foods.  Cook foods using methods other than frying.  Limit canned vegetables. If you do use them, rinse them well to decrease the sodium.  When eating at a restaurant, ask that your food be prepared with less salt, or no salt if possible. WHAT FOODS CAN I EAT? Seek help from a dietitian for individual calorie needs. Grains Whole grain or whole wheat bread. Brown rice. Whole grain or whole wheat pasta. Quinoa, bulgur, and whole grain cereals. Low-sodium cereals. Corn or whole wheat flour tortillas. Whole grain cornbread. Whole grain crackers. Low-sodium crackers. Vegetables Fresh or frozen vegetables (raw, steamed, roasted, or grilled). Low-sodium or reduced-sodium tomato and vegetable juices. Low-sodium or reduced-sodium tomato sauce and paste. Low-sodium or reduced-sodium canned vegetables.  Fruits All fresh, canned (in natural juice), or frozen fruits. Meat and Other Protein Products Ground  beef (85% or leaner), grass-fed beef, or beef trimmed of fat. Skinless chicken or [place]. Ground chicken or [place]. Pork trimmed of fat. All fish and seafood. Eggs. Dried beans, peas, or lentils. Unsalted nuts and seeds. Unsalted canned beans. Dairy Low-fat dairy products, such as skim or 1% milk, 2% or reduced-fat cheeses, low-fat ricotta or cottage cheese, or plain low-fat yogurt. Low-sodium or reduced-sodium cheeses. Fats and Oils Tub margarines without trans fats. Light or reduced-fat mayonnaise and salad dressings (reduced sodium). Avocado. Safflower, olive, or canola oils. Natural peanut or almond butter. Other Unsalted popcorn and pretzels. The items listed above may not be a complete list of recommended foods or beverages. Contact your dietitian for more options. WHAT FOODS ARE NOT RECOMMENDED? Grains White bread. White pasta. White rice. Refined cornbread. Bagels and croissants. Crackers that contain trans fat. Vegetables Creamed or fried vegetables. Vegetables in a cheese sauce. Regular canned vegetables. Regular canned tomato sauce and paste. Regular tomato and vegetable juices. Fruits Dried fruits. Canned fruit in light or heavy syrup. Fruit juice. Meat and Other Protein Products Fatty cuts of meat. Ribs, chicken wings, bacon, sausage, bologna, salami, chitterlings, fatback, hot dogs, bratwurst, and packaged luncheon meats. Salted nuts and seeds. Canned beans with salt. Dairy Whole or 2% milk, cream, half-and-half, and cream cheese. Whole-fat or sweetened yogurt. Full-fat cheeses or blue cheese. Nondairy creamers and whipped toppings. Processed cheese, cheese spreads, or cheese curds. Condiments Onion and garlic salt, seasoned salt, table salt, and sea salt. Canned and packaged gravies. Worcestershire sauce. Tartar sauce. Barbecue sauce. Teriyaki sauce. Soy sauce, including reduced sodium. Steak sauce. Fish sauce. Oyster sauce. Cocktail sauce. Horseradish. Ketchup and mustard. Meat  flavorings and tenderizers. Bouillon cubes. Hot sauce. Tabasco sauce. Marinades. Taco seasonings. Relishes. Fats and Oils Butter, stick margarine, lard, shortening, ghee, and bacon fat. Coconut, palm kernel, or palm oils. Regular salad dressings. Other Pickles and olives. Salted popcorn and pretzels. The items listed above may not be a complete list of foods and beverages to avoid. Contact your dietitian for more information. WHERE CAN I FIND MORE INFORMATION? National Heart, Lung, and Blood Institute: [employer].itwww.nhlbi.nih.gov/health/health-topics/topics/dash/   This information is not intended to replace advice given to you by your health care provider. Make sure you discuss any questions you have with your health care provider.   Document Released: 05/02/2011 Document Revised: 06/03/2014 Document Reviewed: 03/17/2013 Elsevier Interactive Patient Education 2016 Elsevier Inc.  Sinusitis, Adult Sinusitis is redness, soreness, and inflammation of the paranasal sinuses. Paranasal sinuses are air pockets within  the bones of your face. They are located beneath your eyes, in the middle of your forehead, and above your eyes. In healthy paranasal sinuses, mucus is able to drain out, and air is able to circulate through them by way of your nose. However, when your paranasal sinuses are inflamed, mucus and air can become trapped. This can allow bacteria and other germs to grow and cause infection. Sinusitis can develop quickly and last only a short time (acute) or continue over a long period (chronic). Sinusitis that lasts for more than 12 weeks is considered chronic. CAUSES Causes of sinusitis include:  Allergies.  Structural abnormalities, such as displacement of the cartilage that separates your nostrils (deviated septum), which can decrease the air flow through your nose and sinuses and affect sinus drainage.  Functional abnormalities, such as when the small hairs (cilia) that line your sinuses and help  remove mucus do not work properly or are not present. SIGNS AND SYMPTOMS Symptoms of acute and chronic sinusitis are the same. The primary symptoms are pain and pressure around the affected sinuses. Other symptoms include:  Upper toothache.  Earache.  Headache.  Bad breath.  Decreased sense of smell and taste.  A cough, which worsens when you are lying flat.  Fatigue.  Fever.  Thick drainage from your nose, which often is green and may contain pus (purulent).  Swelling and warmth over the affected sinuses. DIAGNOSIS Your health care provider will perform a physical exam. During your exam, your health care provider may perform any of the following to help determine if you have acute sinusitis or chronic sinusitis:  Look in your nose for signs of abnormal growths in your nostrils (nasal polyps).  Tap over the affected sinus to check for signs of infection.  View the inside of your sinuses using an imaging device that has a light attached (endoscope). If your health care provider suspects that you have chronic sinusitis, one or more of the following tests may be recommended:  Allergy tests.  Nasal culture. A sample of mucus is taken from your nose, sent to a lab, and screened for bacteria.  Nasal cytology. A sample of mucus is taken from your nose and examined by your health care provider to determine if your sinusitis is related to an allergy. TREATMENT Most cases of acute sinusitis are related to a viral infection and will resolve on their own within 10 days. Sometimes, medicines are prescribed to help relieve symptoms of both acute and chronic sinusitis. These may include pain medicines, decongestants, nasal steroid sprays, or saline sprays. However, for sinusitis related to a bacterial infection, your health care provider will prescribe antibiotic medicines. These are medicines that will help kill the bacteria causing the infection. Rarely, sinusitis is caused by a fungal  infection. In these cases, your health care provider will prescribe antifungal medicine. For some cases of chronic sinusitis, surgery is needed. Generally, these are cases in which sinusitis recurs more than 3 times per year, despite other treatments. HOME CARE INSTRUCTIONS  Drink plenty of water. Water helps thin the mucus so your sinuses can drain more easily.  Use a humidifier.  Inhale steam 3-4 times a day (for example, sit in the bathroom with the shower running).  Apply a warm, moist washcloth to your face 3-4 times a day, or as directed by your health care provider.  Use saline nasal sprays to help moisten and clean your sinuses.  Take medicines only as directed by your health care provider.  If you were prescribed either an antibiotic or antifungal medicine, finish it all even if you start to feel better. SEEK IMMEDIATE MEDICAL CARE IF:  You have increasing pain or severe headaches.  You have nausea, vomiting, or drowsiness.  You have swelling around your face.  You have vision problems.  You have a stiff neck.  You have difficulty breathing.   This information is not intended to replace advice given to you by your health care provider. Make sure you discuss any questions you have with your health care provider.   Document Released: 05/13/2005 Document Revised: 06/03/2014 Document Reviewed: 05/28/2011 Elsevier Interactive Patient Education 2016 Else[employer] Inc.  Viral Infections A virus is a type of germ. Viruses can cause:  Minor sore throats.  Aches and pains.  Headaches.  Runny nose.  Rashes.  Watery eyes.  Tiredness.  Coughs.  Loss of appetite.  Feeling sick to your stomach (nausea).  Throwing up (vomiting).  Watery poop (diarrhea). HOME CARE   Only take medicines as told by your doctor.  Drink enough water and fluids to keep your pee (urine) clear or pale yellow. Sports drinks are a good choice.  Get plenty of rest and eat healthy. Soups  and broths with crackers or rice are fine. GET HELP RIGHT AWAY IF:   You have a very bad headache.  You have shortness of breath.  You have chest pain or neck pain.  You have an unusual rash.  You cannot stop throwing up.  You have watery poop that does not stop.  You cannot keep fluids down.  You or your child has a temperature by mouth above 102 F (38.9 C), not controlled by medicine.  Your baby is older than 3 months with a rectal temperature of 102 F (38.9 C) or higher.  Your baby is 60 months old or younger with a rectal temperature of 100.4 F (38 C) or higher. MAKE SURE YOU:   Understand these instructions.  Will watch this condition.  Will get help right away if you are not doing well or get worse.   This information is not intended to replace advice given to you by your health care provider. Make sure you discuss any questions you have with your health care provider.   Document Released: 04/25/2008 Document Revised: 08/05/2011 Document Reviewed: 10/19/2014 Elsevier Interactive Patient Education [employer]

## 2015-05-06 NOTE — ED Provider Notes (Signed)
CSN: [id number]     Arrival date & time 05/06/15  2020 History   First MD Initiated Contact with Patient 05/06/15 2044     Chief Complaint  Patient presents with  . Hypertension     (Consider location/radiation/quality/duration/timing/severity/associated sxs/prior Treatment) HPI Comments: Amy Luna is a 59 y.o. female with a PMHx of DM2 and HTN, who presents to the ED with complaints of neck pain, body aches, head fullness/sinus congestion, and clear rhinorrhea 1 week, and concern for hypertension. Patient states that for the last week she has not felt well, complaining of 6/10 sore achy neck pain which is intermittent nonradiating worse with looking down at her desk for prolonged periods of time and improved with Aleve. 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 She is also had URI symptoms including clear rhinorrhea, sinus congestion, and body aches, with +sick contacts at home stating her boyfriend at home is sick with similar symptoms. Patient states this morning she felt somewhat sluggish, she drank 2 cups of coffee, and subsequently she took her blood pressure with the first reading being 171/70 in the second reading being 197/73. She became concerned which prompted her to calm to the ER. She hasn't been taking any other meds for her symptoms, and she takes her BP meds every morning including today (on amlodipine/benazepril 5-10mg  QD).  She denies any vision changes, ear pain or drainage, tinnitus, hearing loss, lightheadedness or syncope, sore throat, headache, neck stiffness, fevers, chills, chest pain, shortness breath, cough, leg swelling, abdominal pain, nausea, vomiting, diarrhea, constipation, dysuria, hematuria, numbness, tingling, or weakness.    Patient is a 59 y.o. female presenting with hypertension and URI. The history is provided by the patient. No language interpreter was used.  Hypertension This is a  chronic problem. The current episode started today. The problem occurs constantly. The problem has been unchanged. Associated symptoms include congestion (sinus), myalgias (body aches) and neck pain. Pertinent negatives include no abdominal pain, arthralgias, chest pain, chills, coughing, fever, headaches, nausea, numbness, sore throat, vertigo, visual change, vomiting or weakness. Nothing aggravates the symptoms. She has tried nothing for the symptoms. The treatment provided no relief.  URI Presenting symptoms: congestion (sinus) and rhinorrhea   Presenting symptoms: no cough, no ear pain, no fever and no sore throat   Severity:  Moderate Onset quality:  Gradual Duration:  1 week Timing:  Constant Progression:  Unchanged Chronicity:  New Relieved by:  None tried Worsened by:  Nothing tried Ineffective treatments:  None tried Associated symptoms: myalgias (body aches), neck pain and sinus pain   Associated symptoms: no arthralgias and no headaches   Risk factors: diabetes mellitus and sick contacts   Risk factors: no recent travel     Past Medical History  Diagnosis Date  . Diabetes mellitus (HCC)   . Hypertension    Past Surgical History  Procedure Laterality Date  . No prior surgery    . Colonoscopy  2008    internal hemorrhoids; otherwise normal.  Dr Virginia Rochester   Family History  Problem Relation Age of Onset  . Colon cancer Neg Hx   . Stomach cancer Neg Hx   . Diabetes Mother   . Hypertension Mother    Social History  Substance Use Topics  . Smoking status: Former Smoker    Quit date: 01/31/1979  . Smokeless tobacco: Never Used  . Alcohol Use: Yes     Comment: occasional  OB History    No data available     Review of Systems  Constitutional: Negative for fever and chills.  HENT: Positive for congestion (sinus), rhinorrhea and sinus pressure. Negative for ear discharge, ear pain, hearing loss, sore throat and tinnitus.   Eyes: Negative for pain and visual disturbance.   Respiratory: Negative for cough and shortness of breath.   Cardiovascular: Negative for chest pain and leg swelling.  Gastrointestinal: Negative for nausea, vomiting, abdominal pain, diarrhea and constipation.  Genitourinary: Negative for dysuria and hematuria.  Musculoskeletal: Positive for myalgias (body aches) and neck pain. Negative for arthralgias and neck stiffness.  Skin: Negative for color change.  Allergic/Immunologic: Positive for immunocompromised state (diabetic).  Neurological: Negative for vertigo, syncope, weakness, light-headedness, numbness and headaches.  Psychiatric/Behavioral: Negative for confusion.   10 Systems reviewed and are negative for acute change except as noted in the HPI.    Allergies  Review of patient's allergies indicates no known allergies.  Home Medications   Prior to Admission medications   Medication Sig Start Date End Date Taking? Authorizing Provider  amLODipine-benazepril (LOTREL) 5-10 MG per capsule Take 1 capsule by mouth daily.  01/07/12   Historical Provider, MD  APIDRA SOLOSTAR 100 UNIT/ML injection Inject 7 Units into the skin as needed. Sliding scale as needed 01/29/12   Historical Provider, MD  B-D ULTRAFINE III SHORT PEN 31G X 8 MM MISC  11/08/11   Historical Provider, MD  GARCINIA CAMBOGIA-CHROMIUM PO Take 2 capsules by mouth daily.    Historical Provider, MD  LANTUS SOLOSTAR 100 UNIT/ML injection Inject 45 Units into the skin at bedtime.  12/15/11   Historical Provider, MD  Multiple Vitamin (MULTIVITAMIN) tablet Take 1 tablet by mouth daily.    Historical Provider, MD  ONE TOUCH ULTRA TEST test strip  11/07/11   Historical Provider, MD   BP 180/81 mmHg  Pulse 57  Temp(Src) 98 F (36.7 C) (Oral)  Resp 20  SpO2 100% Physical Exam  Constitutional: She is oriented to person, place, and time. Vital signs are normal. She appears well-developed and well-nourished.  Non-toxic appearance. No distress.  Afebrile, nontoxic, NAD  HENT:  Head:  Normocephalic and atraumatic.  Right Ear: Hearing, tympanic membrane, external ear and ear canal normal.  Left Ear: Hearing, tympanic membrane, external ear and ear canal normal.  Nose: Mucosal edema and rhinorrhea present. Right sinus exhibits frontal sinus tenderness. Right sinus exhibits no maxillary sinus tenderness. Left sinus exhibits frontal sinus tenderness. Left sinus exhibits no maxillary sinus tenderness.  Mouth/Throat: Uvula is midline, oropharynx is clear and moist and mucous membranes are normal. No trismus in the jaw. No uvula swelling.  Ears are clear bilaterally. Nose with nasal turbinate edema bilaterally and clear rhinorrhea, and moderate b/l frontal sinus TTP. Oropharynx clear and moist, without uvular swelling or deviation, no trismus or drooling, no tonsillar swelling or erythema, no exudates.    Eyes: Conjunctivae and EOM are normal. Pupils are equal, round, and reactive to light. Right eye exhibits no discharge. Left eye exhibits no discharge.  PERRL, EOMI, no nystagmus, no visual field deficits   Neck: Normal range of motion. Neck supple. No spinous process tenderness and no muscular tenderness present. No rigidity. Normal range of motion present.  FROM intact without spinous process TTP, no bony stepoffs or deformities, no paraspinous muscle TTP or muscle spasms. No rigidity or meningeal signs. No bruising or swelling.   Cardiovascular: Normal rate, regular rhythm, normal heart sounds and intact distal pulses.  Exam  reveals no gallop and no friction rub.   No murmur heard. Pulmonary/Chest: Effort normal and breath sounds normal. No respiratory distress. She has no decreased breath sounds. She has no wheezes. She has no rhonchi. She has no rales.  Abdominal: Soft. Normal appearance and bowel sounds are normal. She exhibits no distension. There is no tenderness. There is no rigidity, no rebound and no guarding.  Musculoskeletal: Normal range of motion.  MAE x4 Strength and  sensation grossly intact Distal pulses intact Gait steady  Neurological: She is alert and oriented to person, place, and time. She has normal strength. No cranial nerve deficit or sensory deficit. She displays a negative Romberg sign. Coordination and gait normal. GCS eye subscore is 4. GCS verbal subscore is 5. GCS motor subscore is 6.  CN 2-12 grossly intact A&O x4 GCS 15 Sensation and strength intact Gait nonataxic including with tandem walking Coordination with finger-to-nose WNL Neg pronator drift , neg romberg  Skin: Skin is warm, dry and intact. No rash noted.  Psychiatric: She has a normal mood and affect.  Nursing note and vitals reviewed.   ED Course  Procedures (including critical care time) Labs Review Labs Reviewed - No data to display  Imaging Review No results found. I have personally reviewed and evaluated these images and lab results as part of my medical decision-making.   EKG Interpretation None      MDM   Final diagnoses:  HTN (hypertension), benign  Nasal congestion  Acute frontal sinusitis, recurrence not specified  URI (upper respiratory infection)  Neck pain    59 y.o. female here with body aches, sinus congestion, clear rhinorrhea, head fullness with neck soreness 1 week. +sick contacts at home. Became concerned when she took her blood pressure at home and it was in the 170s to 190s systolic. This is after 2 cups of coffee. She is compliant with her blood pressure medications. She denies that she has a headache, no focal neuro deficits on exam, frontal sinus tenderness on exam with nasal congestion noted. Given that she has asymptomatic for hypertension, and blood pressure here is 180/81, and has a normal neuro exam with no complaints of headache, doubt need for head CT or work up. Symptoms likely from URI/sinusitis. Doubt that treating her BP here would be helpful since it could potentially cause big drop in BP and more symptoms. Pt not maxed out on  her current BP meds, could potentially warrant increasing dose vs starting new med. Discussed avoidance of caffeine, stimulant meds, and salty food intake. Discussed monitoring BP at home and f/up with PCP on Monday to discuss possible changes in med regimen. Strict return precautions given. Symptomatic control of URI symptoms discussed. I explained the diagnosis and have given explicit precautions to return to the ER including for any other new or worsening symptoms. The patient understands and accepts the medical plan as it's been dictated and I have answered their questions. Discharge instructions concerning home care and prescriptions have been given. The patient is STABLE and is discharged to home in good condition.   BP 180/81 mmHg  Pulse 57  Temp(Src) 98 F (36.7 C) (Oral)  Resp 20  SpO2 100%  No orders of the defined types were placed in this encounter.     Marino Rogerson Camprubi-Soms, PA-C 05/06/15 2115  Alvira MondayErin Schlossman, MD 05/08/15 1246

## 2016-04-29 ENCOUNTER — Encounter: Payer: Self-pay | Admitting: Gastroenterology

## 2016-10-29 ENCOUNTER — Ambulatory Visit: Payer: BC Managed Care – PPO | Admitting: Podiatry

## 2016-11-12 ENCOUNTER — Ambulatory Visit (INDEPENDENT_AMBULATORY_CARE_PROVIDER_SITE_OTHER): Payer: BC Managed Care – PPO | Admitting: Podiatry

## 2016-11-12 ENCOUNTER — Encounter: Payer: Self-pay | Admitting: Podiatry

## 2016-11-12 DIAGNOSIS — Q828 Other specified congenital malformations of skin: Secondary | ICD-10-CM | POA: Diagnosis not present

## 2016-11-12 NOTE — Progress Notes (Signed)
   Subjective:    Patient ID: Amy Luna, female    DOB: 10/27/1955, 61 y.o.   MRN: [id number]  HPI: She presents today with a chief complaint of painful small punctated lesions plantar aspect of the bilateral foot states that they've been there for about 3 months or so she stresses soaking and trim them but they're tender. She is a Runner, broadcasting/film/videoteacher.    Review of Systems  All other systems reviewed and are negative.      Objective:   Physical Exam: Vital signs are stable alert and oriented 3 pulses are palpable. Neurologic sensorium is intact. Flexors are intact. Muscle strength +5 over 5 dorsiflexes plantar flexors and inverters everters all intrinsic musculature is intact. Orthopedic evaluation demonstrates also assisted to the angle full range of motion without crepitation. Mild pes planus flexible nature is noted bilateral. Cutaneous evaluation does demonstrate multiple porokeratotic lesions plantar aspect of the bilateral foot. Left foot seems to be the worst. These were enucleated today.        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  Plan: Debrided all reactive hyperkeratotic lesions today placed salicylic acid/Cantharone under occlusion to be washed thoroughly tomorrow. I will follow-up with her in 6 weeks necessary.

## 2016-12-16 ENCOUNTER — Encounter: Payer: Self-pay | Admitting: Gastroenterology

## 2016-12-23 ENCOUNTER — Observation Stay (HOSPITAL_COMMUNITY)
Admission: EM | Admit: 2016-12-23 | Discharge: 2016-12-24 | Disposition: A | Discharge status: Home / Self Care | Payer: BC Managed Care – PPO | Attending: Family Medicine | Admitting: Family Medicine

## 2016-12-23 ENCOUNTER — Encounter (HOSPITAL_COMMUNITY): Payer: Self-pay | Admitting: *Deleted

## 2016-12-23 ENCOUNTER — Emergency Department (HOSPITAL_COMMUNITY): Payer: BC Managed Care – PPO

## 2016-12-23 DIAGNOSIS — Z87891 Personal history of nicotine dependence: Secondary | ICD-10-CM | POA: Diagnosis not present

## 2016-12-23 DIAGNOSIS — Z79899 Other long term (current) drug therapy: Secondary | ICD-10-CM | POA: Diagnosis not present

## 2016-12-23 DIAGNOSIS — I10 Essential (primary) hypertension: Secondary | ICD-10-CM | POA: Diagnosis present

## 2016-12-23 DIAGNOSIS — E1010 Type 1 diabetes mellitus with ketoacidosis without coma: Secondary | ICD-10-CM | POA: Diagnosis not present

## 2016-12-23 DIAGNOSIS — Z794 Long term (current) use of insulin: Secondary | ICD-10-CM | POA: Insufficient documentation

## 2016-12-23 DIAGNOSIS — E1110 Type 2 diabetes mellitus with ketoacidosis without coma: Principal | ICD-10-CM | POA: Diagnosis present

## 2016-12-23 DIAGNOSIS — Z9641 Presence of insulin pump (external) (internal): Secondary | ICD-10-CM | POA: Diagnosis not present

## 2016-12-23 DIAGNOSIS — E872 Acidosis: Secondary | ICD-10-CM | POA: Diagnosis not present

## 2016-12-23 LAB — COMPREHENSIVE METABOLIC PANEL
ALT: 13 U/L — ABNORMAL LOW (ref 14–54)
ANION GAP: 16 — AB (ref 5–15)
AST: 26 U/L (ref 15–41)
Albumin: 4.3 g/dL (ref 3.5–5.0)
Alkaline Phosphatase: 60 U/L (ref 38–126)
BUN: 23 mg/dL — ABNORMAL HIGH (ref 6–20)
CHLORIDE: 99 mmol/L — AB (ref 101–111)
CO2: 19 mmol/L — AB (ref 22–32)
Calcium: 9.4 mg/dL (ref 8.9–10.3)
Creatinine, Ser: 1.18 mg/dL — ABNORMAL HIGH (ref 0.44–1.00)
GFR calc non Af Amer: 48 mL/min — ABNORMAL LOW (ref >60)
GFR, EST AFRICAN AMERICAN: 56 mL/min — AB (ref >60)
Glucose, Bld: 647 mg/dL (ref 65–99)
Potassium: 4.5 mmol/L (ref 3.5–5.1)
Sodium: 134 mmol/L — ABNORMAL LOW (ref 135–145)
TOTAL PROTEIN: 7.3 g/dL (ref 6.5–8.1)
Total Bilirubin: 1.1 mg/dL (ref 0.3–1.2)

## 2016-12-23 LAB — GLUCOSE, CAPILLARY
GLUCOSE-CAPILLARY: 223 mg/dL — AB (ref 65–99)
GLUCOSE-CAPILLARY: 168 mg/dL — AB (ref 65–99)
GLUCOSE-CAPILLARY: 168 mg/dL — AB (ref 65–99)
GLUCOSE-CAPILLARY: 196 mg/dL — AB (ref 65–99)
Glucose-Capillary: 215 mg/dL — ABNORMAL HIGH (ref 65–99)
Glucose-Capillary: 190 mg/dL — ABNORMAL HIGH (ref 65–99)
Glucose-Capillary: 193 mg/dL — ABNORMAL HIGH (ref 65–99)

## 2016-12-23 LAB — CBC
HEMATOCRIT: 32.4 % — AB (ref 36.0–46.0)
HEMOGLOBIN: 11 g/dL — AB (ref 12.0–15.0)
MCH: 30.2 pg (ref 26.0–34.0)
MCHC: 34 g/dL (ref 30.0–36.0)
MCV: 89 fL (ref 78.0–100.0)
Platelets: 184 10*3/uL (ref 150–400)
RBC: 3.64 MIL/uL — AB (ref 3.87–5.11)
RDW: 12.6 % (ref 11.5–15.5)
WBC: 11.5 10*3/uL — AB (ref 4.0–10.5)

## 2016-12-23 LAB — CBC WITH DIFFERENTIAL/PLATELET
Basophils Absolute: 0 10*3/uL (ref 0.0–0.1)
Basophils Relative: 0 %
EOS PCT: 0 %
Eosinophils Absolute: 0.1 10*3/uL (ref 0.0–0.7)
HCT: 35.9 % — ABNORMAL LOW (ref 36.0–46.0)
Hemoglobin: 12.3 g/dL (ref 12.0–15.0)
LYMPHS ABS: 2.6 10*3/uL (ref 0.7–4.0)
Lymphocytes Relative: 16 %
MCH: 30.4 pg (ref 26.0–34.0)
MCHC: 34.3 g/dL (ref 30.0–36.0)
MCV: 88.9 fL (ref 78.0–100.0)
MONOS PCT: 2 %
Monocytes Absolute: 0.4 10*3/uL (ref 0.1–1.0)
Neutro Abs: 13.4 10*3/uL — ABNORMAL HIGH (ref 1.7–7.7)
Neutrophils Relative %: 82 %
PLATELETS: 202 10*3/uL (ref 150–400)
RBC: 4.04 MIL/uL (ref 3.87–5.11)
RDW: 12.6 % (ref 11.5–15.5)
WBC: 16.5 10*3/uL — ABNORMAL HIGH (ref 4.0–10.5)

## 2016-12-23 LAB — BASIC METABOLIC PANEL
ANION GAP: 9 (ref 5–15)
BUN: 18 mg/dL (ref 6–20)
CO2: 23 mmol/L (ref 22–32)
Calcium: 8.8 mg/dL — ABNORMAL LOW (ref 8.9–10.3)
Chloride: 108 mmol/L (ref 101–111)
Creatinine, Ser: 0.8 mg/dL (ref 0.44–1.00)
GFR calc Af Amer: >60 mL/min (ref >60)
GFR calc non Af Amer: >60 mL/min (ref >60)
GLUCOSE: 230 mg/dL — AB (ref 65–99)
POTASSIUM: 4.4 mmol/L (ref 3.5–5.1)
Sodium: 140 mmol/L (ref 135–145)

## 2016-12-23 LAB — URINALYSIS, ROUTINE W REFLEX MICROSCOPIC
BACTERIA UA: NONE SEEN
Bilirubin Urine: NEGATIVE
Glucose, UA: >=500 mg/dL — AB
HGB URINE DIPSTICK: NEGATIVE
Ketones, ur: 20 mg/dL — AB
Leukocytes, UA: NEGATIVE
Nitrite: NEGATIVE
PROTEIN: NEGATIVE mg/dL
SPECIFIC GRAVITY, URINE: 1.023 (ref 1.005–1.030)
SQUAMOUS EPITHELIAL / LPF: NONE SEEN
pH: 5 (ref 5.0–8.0)

## 2016-12-23 LAB — I-STAT CHEM 8, ED
BUN: 31 mg/dL — ABNORMAL HIGH (ref 6–20)
CALCIUM ION: 1.09 mmol/L — AB (ref 1.15–1.40)
CHLORIDE: 100 mmol/L — AB (ref 101–111)
CREATININE: 1.1 mg/dL — AB (ref 0.44–1.00)
GLUCOSE: 653 mg/dL — AB (ref 65–99)
HCT: 40 % (ref 36.0–46.0)
Hemoglobin: 13.6 g/dL (ref 12.0–15.0)
POTASSIUM: 4.6 mmol/L (ref 3.5–5.1)
SODIUM: 134 mmol/L — AB (ref 135–145)
TCO2: 23 mmol/L (ref 0–100)

## 2016-12-23 LAB — BLOOD GAS, VENOUS
Acid-base deficit: 1.6 mmol/L (ref 0.0–2.0)
BICARBONATE: 24.1 mmol/L (ref 20.0–28.0)
O2 Saturation: 26.2 %
PATIENT TEMPERATURE: 98.6
PH VEN: 7.335 (ref 7.250–7.430)
pCO2, Ven: 46.4 mmHg (ref 44.0–60.0)

## 2016-12-23 LAB — I-STAT CG4 LACTIC ACID, ED
LACTIC ACID, VENOUS: 4.44 mmol/L — AB (ref 0.5–1.9)
Lactic Acid, Venous: 2.91 mmol/L (ref 0.5–1.9)

## 2016-12-23 LAB — MRSA PCR SCREENING: MRSA by PCR: NEGATIVE

## 2016-12-23 LAB — CBG MONITORING, ED
GLUCOSE-CAPILLARY: 426 mg/dL — AB (ref 65–99)
GLUCOSE-CAPILLARY: 253 mg/dL — AB (ref 65–99)
GLUCOSE-CAPILLARY: 255 mg/dL — AB (ref 65–99)
Glucose-Capillary: 328 mg/dL — ABNORMAL HIGH (ref 65–99)

## 2016-12-23 LAB — I-STAT TROPONIN, ED: Troponin i, poc: 0 ng/mL (ref 0.00–0.08)

## 2016-12-23 MED ORDER — ACETAMINOPHEN 650 MG RE SUPP: 650.0000 mg | Freq: Four times a day (QID) | RECTAL | Status: DC | PRN

## 2016-12-23 MED ORDER — METOCLOPRAMIDE HCL 5 MG/ML IJ SOLN
10.0000 mg | Freq: Once | INTRAMUSCULAR | Status: AC
  Administered 2016-12-23: 10 mg via INTRAVENOUS
  Filled 2016-12-23: qty 2

## 2016-12-23 MED ORDER — AMLODIPINE BESYLATE 10 MG PO TABS
10.0000 mg | ORAL_TABLET | Freq: Every day | ORAL | Status: DC
  Administered 2016-12-23 – 2016-12-24 (×2): 10 mg via ORAL
  Filled 2016-12-23 (×2): qty 1

## 2016-12-23 MED ORDER — DEXTROSE-NACL 5-0.45 % IV SOLN: INTRAVENOUS | Status: DC

## 2016-12-23 MED ORDER — HYDRALAZINE HCL 20 MG/ML IJ SOLN
5.0000 mg | Freq: Once | INTRAMUSCULAR | Status: AC
  Administered 2016-12-23: 5 mg via INTRAVENOUS
  Filled 2016-12-23: qty 1

## 2016-12-23 MED ORDER — ACETAMINOPHEN 325 MG PO TABS
650.0000 mg | ORAL_TABLET | Freq: Four times a day (QID) | ORAL | Status: DC | PRN
  Administered 2016-12-23: 650 mg via ORAL
  Filled 2016-12-23: qty 2

## 2016-12-23 MED ORDER — ENOXAPARIN SODIUM 40 MG/0.4ML ~~LOC~~ SOLN
40.0000 mg | SUBCUTANEOUS | Status: DC
  Administered 2016-12-23: 40 mg via SUBCUTANEOUS

## 2016-12-23 MED ORDER — SODIUM CHLORIDE 0.9 % IV SOLN: INTRAVENOUS | Status: AC

## 2016-12-23 MED ORDER — ONDANSETRON HCL 4 MG PO TABS
4.0000 mg | ORAL_TABLET | Freq: Four times a day (QID) | ORAL | Status: DC | PRN
  Administered 2016-12-24: 4 mg via ORAL
  Filled 2016-12-23: qty 1

## 2016-12-23 MED ORDER — INSULIN ASPART 100 UNIT/ML ~~LOC~~ SOLN: 0.0000 [IU] | Freq: Every day | SUBCUTANEOUS | Status: DC

## 2016-12-23 MED ORDER — SODIUM CHLORIDE 0.9 % IV SOLN: INTRAVENOUS | Status: DC

## 2016-12-23 MED ORDER — SODIUM CHLORIDE 0.9 % IV SOLN
INTRAVENOUS | Status: DC
  Administered 2016-12-23: 21:00:00 via INTRAVENOUS

## 2016-12-23 MED ORDER — SODIUM CHLORIDE 0.9 % IV BOLUS (SEPSIS)
1000.0000 mL | Freq: Once | INTRAVENOUS | Status: AC
  Administered 2016-12-23: 1000 mL via INTRAVENOUS

## 2016-12-23 MED ORDER — ONDANSETRON HCL 4 MG/2ML IJ SOLN: 4.0000 mg | Freq: Four times a day (QID) | INTRAMUSCULAR | Status: DC | PRN

## 2016-12-23 MED ORDER — DEXTROSE-NACL 5-0.45 % IV SOLN
INTRAVENOUS | Status: AC
  Administered 2016-12-23: 15:00:00 via INTRAVENOUS

## 2016-12-23 MED ORDER — INSULIN ASPART 100 UNIT/ML ~~LOC~~ SOLN
0.0000 [IU] | Freq: Three times a day (TID) | SUBCUTANEOUS | Status: DC
  Administered 2016-12-24: 5 [IU] via SUBCUTANEOUS
  Administered 2016-12-24: 3.2 [IU] via SUBCUTANEOUS

## 2016-12-23 MED ORDER — POLYVINYL ALCOHOL 1.4 % OP SOLN: 1.0000 [drp] | Freq: Every day | OPHTHALMIC | Status: DC | PRN

## 2016-12-23 MED ORDER — SODIUM CHLORIDE 0.9 % IV SOLN
INTRAVENOUS | Status: DC
  Administered 2016-12-23: 12:00:00 via INTRAVENOUS

## 2016-12-23 MED ORDER — HYDRALAZINE HCL 20 MG/ML IJ SOLN
10.0000 mg | Freq: Four times a day (QID) | INTRAMUSCULAR | Status: DC | PRN
  Administered 2016-12-23 – 2016-12-24 (×3): 10 mg via INTRAVENOUS
  Filled 2016-12-23 (×3): qty 1

## 2016-12-23 MED ORDER — INSULIN REGULAR HUMAN 100 UNIT/ML IJ SOLN
INTRAMUSCULAR | Status: DC
  Administered 2016-12-23: 3.7 [IU]/h via INTRAVENOUS
  Filled 2016-12-23: qty 1

## 2016-12-23 MED ORDER — INSULIN GLARGINE 100 UNIT/ML ~~LOC~~ SOLN
30.0000 [IU] | Freq: Every day | SUBCUTANEOUS | Status: DC
  Administered 2016-12-23 – 2016-12-24 (×2): 30 [IU] via SUBCUTANEOUS
  Filled 2016-12-23 (×2): qty 0.3

## 2016-12-23 NOTE — ED Notes (Signed)
Abnormal lab result MD Schlossman have been made aware 

## 2016-12-23 NOTE — Progress Notes (Signed)
CBG at 1525: 215, stabilizer updated. Insulin gtt adjusted.

## 2016-12-23 NOTE — ED Notes (Signed)
Per EMS pt from home with c/o hyperglycemia, per EMS pt took off her insulin pump last night, and woke up this morning with nausea, vomiting, and blood sugar 589. Per EMS pt administered 14 units of Humalog, and CBG for EMS was 562. Pt is alert and oriented,  B/p 138/59, hr 104 99%on ra

## 2016-12-23 NOTE — ED Notes (Signed)
Hospitalist at bedside 

## 2016-12-23 NOTE — Progress Notes (Signed)
MD paged with results of 1605 BMET results.

## 2016-12-23 NOTE — ED Notes (Signed)
Patient transported to X-ray 

## 2016-12-23 NOTE — Progress Notes (Signed)
Patient running hypertensive with SBP >190.  MD made aware, and PRN order placed.  PRN Hydralazine administered.  Will monitor.

## 2016-12-23 NOTE — H&P (Addendum)
[facility] History and Physical  Amy Phenixarlene Gallo ZOX:[id number] DOB: 02/16/1956 DOA: 12/23/2016  Referring physician: EDP PCP: Merri BrunettePharr, Walter, MD Caren Martin-Endocrine  Chief Complaint: EDP  HPI: Amy Luna is a 61 y.o. female with past medical history of diabetes on an insulin pump, essential hypertension was feeling okay all day yesterday, and last evening she noticed that her R insulin pump was poking her, felt somewhat uncomfortable and around 8:30 PM last night well after dinner, she checked her blood sugar was 144 she decided to take off the pump, fell asleep and woke up this morning around 7 AM had a normal bowel movement and then started vomiting. She had multiple episodes of vomiting subsequently EMS was called and noted that her blood sugar was 589 subsequently the patient took 14 units of Humalog that she had from the past, was brought to the emergency room where she was noted to have a CBG of 674 with mild anion gap diabetic ketoacidosis.  Review of Systems: 14 system review is negative except per HPI  Past Medical History:  Diagnosis Date  . Diabetes mellitus (HCC)   . Hypertension    Past Surgical History:  Procedure Laterality Date  . COLONOSCOPY  2008   internal hemorrhoids; otherwise normal.  Dr Virginia Rochesterrr  . no prior surgery     Social History:  reports that she quit smoking about 37 years ago. She has never used smokeless tobacco. She reports that she drinks alcohol. She reports that she does not use drugs.  Allergies  Allergen Reactions  . Other     Family History  Problem Relation Age of Onset  . Diabetes Mother   . Hypertension Mother   . Colon cancer Neg Hx   . Stomach cancer Neg Hx    Prior to Admission medications   Medication Sig Start Date End Date Taking? Authorizing Provider  amLODipine-benazepril (LOTREL) 10-20 MG capsule Take 1 capsule by mouth daily. 11/26/16  Yes [provider]  B-D ULTRAFINE III SHORT PEN 31G X 8 MM MISC   11/08/11  Yes [provider]  GARCINIA CAMBOGIA-CHROMIUM PO Take 2 capsules by mouth daily.    Yes [provider]  Insulin Disposable Pump (OMNIPOD 5 PACK) MISC  11/07/16  Yes [provider]  Multiple Vitamin (MULTIVITAMIN) tablet Take 1 tablet by mouth daily.   Yes [provider]  Naproxen Sodium (ALEVE PO) Take 1 tablet by mouth daily as needed (pain).   Yes [provider]  ONE TOUCH ULTRA TEST test strip  11/07/11  Yes [provider]  Polyethyl Glycol-Propyl Glycol (SYSTANE ULTRA OP) Apply 2 drops to eye daily as needed (dry eye).   Yes [provider]  TRULICITY 0.75 MG/0.5ML SOPN  11/07/16  Yes [provider]  NOVOLOG FLEXPEN 100 UNIT/ML FlexPen  09/12/16   [provider]   Physical Exam: Vitals:   12/23/16 1008 12/23/16 1145  BP: (!) 197/73 (!) 193/71  Pulse: 98 88  Resp: 18 20  Temp: (!) 97.4 F (36.3 C)   TempSrc: Oral   SpO2: 100% 98%    Wt Readings from Last 3 Encounters:  05/29/13 92.1 kg (203 lb)  01/31/12 92.8 kg (204 lb 9.6 oz)    General:  Appears calm and comfortable, no distress Eyes: PERRL, normal lids, irises & conjunctiva ENT: grossly normal hearing, lips & tongue Neck: no LAD, masses or thyromegaly Cardiovascular: RRR, no m/r/g. No LE edema. Telemetry: SR, no arrhythmias  Respiratory: CTA bilaterally, no w/r/r.  Normal respiratory effort. Abdomen: soft, ntnd Skin: no rash or induration seen on limited exam Musculoskeletal: grossly normal tone BUE/BLE Psychiatric: grossly normal mood and affect, speech fluent and appropriate Neurologic: grossly non-focal.          Labs on Admission:  Basic Metabolic Panel:  Recent Labs Lab 12/23/16 1004 12/23/16 1010  NA 134* 134*  K 4.5 4.6  CL 99* 100*  CO2 19*  --   GLUCOSE 647* 653*  BUN 23* 31*  CREATININE 1.18* 1.10*  CALCIUM 9.4  --    Liver Function Tests:  Recent Labs Lab 12/23/16 1004  AST 26  ALT 13*    ALKPHOS 60  BILITOT 1.1  PROT 7.3  ALBUMIN 4.3   No results for input(s): LIPASE, AMYLASE in the last 168 hours. No results for input(s): AMMONIA in the last 168 hours. CBC:  Recent Labs Lab 12/23/16 1004 12/23/16 1010  WBC 16.5*  --   NEUTROABS 13.4*  --   HGB 12.3 13.6  HCT 35.9* 40.0  MCV 88.9  --   PLT 202  --    Cardiac Enzymes: No results for input(s): CKTOTAL, CKMB, CKMBINDEX, TROPONINI in the last 168 hours.  BNP (last 3 results) No results for input(s): BNP in the last 8760 hours.  ProBNP (last 3 results) No results for input(s): PROBNP in the last 8760 hours.  CBG:  Recent Labs Lab 12/23/16 1141 12/23/16 1258  GLUCAP 426* 328*    Radiological Exams on Admission: Dg Abd Acute W/chest  Result Date: 12/23/2016 CLINICAL DATA:  Apply see me a with nausea and vomiting. History of hypertension, former smoker. EXAM: DG ABDOMEN ACUTE W/ 1V CHEST COMPARISON:  Chest x-ray of May 29, 2013. FINDINGS: The lungs are adequately inflated and clear. The heart and pulmonary vascularity are normal. The mediastinum is normal in width. There is no pleural effusion. The bony thorax is unremarkable. Within the abdomen the bowel gas pattern is normal. There are numerous phleboliths within the pelvis. The lumbar spine exhibits no acute abnormality. IMPRESSION: No acute cardiopulmonary abnormality. No acute intra-abdominal abnormality. Electronically Signed   By: David  SwazilandJordan M.D.   On: 12/23/2016 12:46    EKG: Independently reviewed. NSR  Assessment/Plan Principal Problem:   DKA (diabetic ketoacidoses) (HCC) -from stopping Insulin pump last pm -anion gap is mildly elevated -continue Insulin pump, IVF, Bmet Q4 until gap closes -clinically no obvious infection and no ACS -DM Coordinator consult to manage Insulin pump  Lactic acidosis -from dehydration and DKA -improving, clinically no other signs of sepsis   Essential hypertension -Bp high now, resume  amlodipine  Code Status: Full Code DVT Prophylaxis:lovenox Family Communication:None at bedside Disposition Plan: home in 1-2days  Time spent: 60min  [facility] Heights Medical CenterJOSEPH,Faith Patricelli [facility] Pager [phone]

## 2016-12-23 NOTE — ED Notes (Addendum)
Date and time results received: 12/23/16 10:42 AM   Test: Glucose Critical Value: 647  Name of Provider Notified:  Tatyana PA   Orders Received? Or Actions Taken?: Waiting for further orders

## 2016-12-23 NOTE — ED Notes (Signed)
Patient given ice water 

## 2016-12-23 NOTE — ED Notes (Signed)
RN at bedside collecting labs 

## 2016-12-23 NOTE — ED Provider Notes (Signed)
[facility]-EMERGENCY DEPT Provider Note   CSN: [id number] Arrival date & time: 12/23/16  0941     History   Chief Complaint Chief Complaint  Patient presents with  . Hyperglycemia    HPI Amy Phenixarlene Luna is a 61 y.o. female.  HPI Amy Phenixarlene Legault is a 61 y.o. female with history of diabetes, hypertension, presents to emergency department complaining of nausea, vomiting, abdominal pain, elevated blood sugar. Patient states she is type I diabetic. She is insulin-dependent. She has a pump and states that it was "poking me" last night so she turned it off. At that time her sugar was 140. She states this morning she woke up with severe nausea, vomiting, abdominal cramping. Her sugar was over 500 this morning. She states she took 14 units of insulin and called EMS. Patient is actively vomiting upon arrival. She states she has generalized abdominal pain. No back pain. No urinary symptoms. Denies any fever or chills. No blood in her stool or emesis.  Past Medical History:  Diagnosis Date  . Diabetes mellitus (HCC)   . Hypertension     There are no active problems to display for this patient.   Past Surgical History:  Procedure Laterality Date  . COLONOSCOPY  2008   internal hemorrhoids; otherwise normal.  Dr Virginia Rochesterrr  . no prior surgery      OB History    No data available       Home Medications    Prior to Admission medications   Medication Sig Start Date End Date Taking? Authorizing Provider  amLODipine-benazepril (LOTREL) 10-20 MG capsule Take 1 capsule by mouth daily. 11/26/16  Yes [provider]  B-D ULTRAFINE III SHORT PEN 31G X 8 MM MISC  11/08/11  Yes [provider]  GARCINIA CAMBOGIA-CHROMIUM PO Take 2 capsules by mouth daily.    Yes [provider]  Insulin Disposable Pump (OMNIPOD 5 PACK) MISC  11/07/16  Yes [provider]  Multiple Vitamin (MULTIVITAMIN) tablet Take 1 tablet by mouth daily.   Yes [provider]  Naproxen Sodium  (ALEVE PO) Take 1 tablet by mouth daily as needed (pain).   Yes [provider]  ONE TOUCH ULTRA TEST test strip  11/07/11  Yes [provider]  Polyethyl Glycol-Propyl Glycol (SYSTANE ULTRA OP) Apply 2 drops to eye daily as needed (dry eye).   Yes [provider]  TRULICITY 0.75 MG/0.5ML SOPN  11/07/16  Yes [provider]  NOVOLOG FLEXPEN 100 UNIT/ML FlexPen  09/12/16   [provider]    Family History Family History  Problem Relation Age of Onset  . Diabetes Mother   . Hypertension Mother   . Colon cancer Neg Hx   . Stomach cancer Neg Hx     Social History Social History  Substance Use Topics  . Smoking status: Former Smoker    Quit date: 01/31/1979  . Smokeless tobacco: Never Used  . Alcohol use Yes     Comment: occasional     Allergies   Other   Review of Systems Review of Systems  Constitutional: Positive for fatigue. Negative for chills and fever.  Respiratory: Negative for cough, chest tightness and shortness of breath.   Cardiovascular: Negative for chest pain, palpitations and leg swelling.  Gastrointestinal: Positive for abdominal pain, nausea and vomiting. Negative for diarrhea.  Genitourinary: Negative for dysuria, flank pain, pelvic pain, vaginal bleeding, vaginal discharge and vaginal pain.  Musculoskeletal: Negative for arthralgias, myalgias, neck pain and neck stiffness.  Skin: Negative  for rash.  Neurological: Positive for weakness. Negative for dizziness and headaches.  All other systems reviewed and are negative.    Physical Exam Updated Vital Signs BP (!) 197/73   Pulse 98   Temp (!) 97.4 F (36.3 C) (Oral)   Resp 18   SpO2 100%   Physical Exam  Constitutional: She appears well-developed and well-nourished. No distress.  HENT:  Head: Normocephalic.  Eyes: Conjunctivae are normal.  Neck: Neck supple.  Cardiovascular: Normal rate, regular rhythm and normal heart sounds.   Pulmonary/Chest: Effort  normal and breath sounds normal. No respiratory distress. She has no wheezes. She has no rales.  Abdominal: Soft. Bowel sounds are normal. She exhibits no distension. There is tenderness. There is no rebound.  Diffuse tenderness  Musculoskeletal: She exhibits no edema.  Neurological: She is alert.  Skin: Skin is warm and dry.  Psychiatric: She has a normal mood and affect. Her behavior is normal.  Nursing note and vitals reviewed.    ED Treatments / Results  Labs (all labs ordered are listed, but only abnormal results are displayed) Labs Reviewed  CBC WITH DIFFERENTIAL/PLATELET - Abnormal; Notable for the following:       Result Value   WBC 16.5 (*)    HCT 35.9 (*)    Neutro Abs 13.4 (*)    All other components within normal limits  COMPREHENSIVE METABOLIC PANEL - Abnormal; Notable for the following:    Sodium 134 (*)    Chloride 99 (*)    CO2 19 (*)    Glucose, Bld 647 (*)    BUN 23 (*)    Creatinine, Ser 1.18 (*)    ALT 13 (*)    GFR calc non Af Amer 48 (*)    GFR calc Af Amer 56 (*)    Anion gap 16 (*)    All other components within normal limits  URINALYSIS, ROUTINE W REFLEX MICROSCOPIC - Abnormal; Notable for the following:    Glucose, UA >=500 (*)    Ketones, ur 20 (*)    All other components within normal limits  I-STAT CG4 LACTIC ACID, ED - Abnormal; Notable for the following:    Lactic Acid, Venous 4.44 (*)    All other components within normal limits  I-STAT CHEM 8, ED - Abnormal; Notable for the following:    Sodium 134 (*)    Chloride 100 (*)    BUN 31 (*)    Creatinine, Ser 1.10 (*)    Glucose, Bld 653 (*)    Calcium, Ion 1.09 (*)    All other components within normal limits  CBG MONITORING, ED - Abnormal; Notable for the following:    Glucose-Capillary 426 (*)    All other components within normal limits  BLOOD GAS, VENOUS  I-STAT TROPONIN, ED    EKG  EKG Interpretation  Date/Time:  Monday December 23 2016 10:06:07 EDT Ventricular Rate:  95 PR  Interval:    QRS Duration: 88 QT Interval:  371 QTC Calculation: 467 R Axis:   62 Text Interpretation:  Sinus rhythm no acute ST/T changes no significant change compared to 2011 Confirmed by Pricilla LovelessGoldston, Scott [phone]([id number]) on 12/23/2016 10:25:56 AM Also confirmed by Pricilla LovelessGoldston, Scott [phone]), editor Madalyn RobEverhart, Marilyn [phone]([id number])  on 12/23/2016 11:25:22 AM       Radiology Dg Abd Acute W/chest  Result Date: 12/23/2016 CLINICAL DATA:  Apply see me a with nausea and vomiting. History of hypertension, former smoker. EXAM: DG ABDOMEN ACUTE W/ 1V CHEST COMPARISON:  Chest x-ray of May 29, 2013. FINDINGS: The lungs are adequately inflated and clear. The heart and pulmonary vascularity are normal. The mediastinum is normal in width. There is no pleural effusion. The bony thorax is unremarkable. Within the abdomen the bowel gas pattern is normal. There are numerous phleboliths within the pelvis. The lumbar spine exhibits no acute abnormality. IMPRESSION: No acute cardiopulmonary abnormality. No acute intra-abdominal abnormality. Electronically Signed   By: David  [place] M.D.   On: 12/23/2016 12:46    Procedures Procedures (including critical care time)  Medications Ordered in ED Medications  polyvinyl alcohol (LIQUIFILM TEARS) 1.4 % ophthalmic solution 1 drop (not administered)  amLODipine (NORVASC) tablet 10 mg (not administered)  enoxaparin (LOVENOX) injection 40 mg (not administered)  acetaminophen (TYLENOL) tablet 650 mg (not administered)    Or  acetaminophen (TYLENOL) suppository 650 mg (not administered)  ondansetron (ZOFRAN) tablet 4 mg (not administered)    Or  ondansetron (ZOFRAN) injection 4 mg (not administered)  0.9 %  sodium chloride infusion ( Intravenous Duplicate 12/23/16 1330)  0.9 %  sodium chloride infusion ( Intravenous Rate/Dose Change 12/23/16 1330)  dextrose 5 %-0.45 % sodium chloride infusion (not administered)  insulin regular (NOVOLIN R,HUMULIN R) 100 Units in sodium chloride 0.9 %  100 mL (1 Units/mL) infusion (not administered)  sodium chloride 0.9 % bolus 1,000 mL (0 mLs Intravenous Stopped 12/23/16 1241)  sodium chloride 0.9 % bolus 1,000 mL (1,000 mLs Intravenous New Bag/Given 12/23/16 1020)  metoCLOPramide (REGLAN) injection 10 mg (10 mg Intravenous Given 12/23/16 1020)     Initial Impression / Assessment and Plan / ED Course  I have reviewed the triage vital signs and the nursing notes.  Pertinent labs & imaging results that were available during my care of the patient were reviewed by me and considered in my medical decision making (see chart for details).     Patient in emergency department with nausea, vomiting, abdominal pain. Elevated glucose, 647. Question DKA. IV fluids started. Will give Reglan for nausea and vomiting, no relief with Zofran. Patient is actively vomiting during my exam. Her abdomen is diffusely tender, with no guarding or rebound tenderness. Will reassess.  11:54 AM Patient is feeling much better at this time. She states her vomiting has resolved, no longer having nausea, pain improved. Will add chest and abdominal films, will admit for possible DKA  Spoke with [facility], will admit.   Vitals:   12/23/16 1008 12/23/16 1145 12/23/16 1346 12/23/16 1430  BP: (!) 197/73 (!) 193/71 (!) 182/85 (!) 185/73  Pulse: 98 88 (!) 103 77  Resp: 18 20 (!) 24 17  Temp: (!) 97.4 F (36.3 C)     TempSrc: Oral     SpO2: 100% 98% 100% 98%     Final Clinical Impressions(s) / ED Diagnoses   Final diagnoses:  Diabetic ketoacidosis without coma associated with type 1 diabetes mellitus [facility])    New Prescriptions Current Discharge Medication List       Jaynie Crumble, PA-C 12/23/16 1508    Pricilla Loveless, MD 12/23/16 [phone]

## 2016-12-23 NOTE — ED Notes (Signed)
Abnormal lab result MD Goldston have been made aware  

## 2016-12-23 NOTE — ED Notes (Signed)
Patient given water and ice. °

## 2016-12-23 NOTE — ED Notes (Signed)
Patient given diet ginger ale.

## 2016-12-24 ENCOUNTER — Encounter (INDEPENDENT_AMBULATORY_CARE_PROVIDER_SITE_OTHER): Payer: BC Managed Care – PPO | Admitting: Podiatry

## 2016-12-24 DIAGNOSIS — I10 Essential (primary) hypertension: Secondary | ICD-10-CM

## 2016-12-24 DIAGNOSIS — E1110 Type 2 diabetes mellitus with ketoacidosis without coma: Secondary | ICD-10-CM | POA: Diagnosis not present

## 2016-12-24 LAB — HIV ANTIBODY (ROUTINE TESTING W REFLEX): HIV SCREEN 4TH GENERATION: NONREACTIVE

## 2016-12-24 LAB — GLUCOSE, CAPILLARY
GLUCOSE-CAPILLARY: 240 mg/dL — AB (ref 65–99)
Glucose-Capillary: 185 mg/dL — ABNORMAL HIGH (ref 65–99)
Glucose-Capillary: 266 mg/dL — ABNORMAL HIGH (ref 65–99)
Glucose-Capillary: 283 mg/dL — ABNORMAL HIGH (ref 65–99)
Glucose-Capillary: 272 mg/dL — ABNORMAL HIGH (ref 65–99)

## 2016-12-24 LAB — BASIC METABOLIC PANEL
Anion gap: 8 (ref 5–15)
BUN: 15 mg/dL (ref 6–20)
CO2: 22 mmol/L (ref 22–32)
CREATININE: 0.81 mg/dL (ref 0.44–1.00)
Calcium: 8.5 mg/dL — ABNORMAL LOW (ref 8.9–10.3)
Chloride: 108 mmol/L (ref 101–111)
GFR calc non Af Amer: >60 mL/min (ref >60)
Glucose, Bld: 253 mg/dL — ABNORMAL HIGH (ref 65–99)
Potassium: 3.6 mmol/L (ref 3.5–5.1)
SODIUM: 138 mmol/L (ref 135–145)

## 2016-12-24 LAB — CBC
HEMATOCRIT: 31.5 % — AB (ref 36.0–46.0)
HEMOGLOBIN: 10.5 g/dL — AB (ref 12.0–15.0)
MCH: 29.9 pg (ref 26.0–34.0)
MCHC: 33.3 g/dL (ref 30.0–36.0)
MCV: 89.7 fL (ref 78.0–100.0)
Platelets: 185 10*3/uL (ref 150–400)
RBC: 3.51 MIL/uL — AB (ref 3.87–5.11)
RDW: 12.9 % (ref 11.5–15.5)
WBC: 11.7 10*3/uL — ABNORMAL HIGH (ref 4.0–10.5)

## 2016-12-24 MED ORDER — BENAZEPRIL HCL 20 MG PO TABS
20.0000 mg | ORAL_TABLET | Freq: Every day | ORAL | Status: DC
  Administered 2016-12-24: 20 mg via ORAL
  Filled 2016-12-24: qty 1

## 2016-12-24 MED ORDER — HYDRALAZINE HCL 20 MG/ML IJ SOLN
5.0000 mg | Freq: Once | INTRAMUSCULAR | Status: AC
  Administered 2016-12-24: 5 mg via INTRAVENOUS
  Filled 2016-12-24: qty 1

## 2016-12-24 NOTE — Progress Notes (Signed)
Patient wanting to do insulin SQ via her personal pump. MD paged to ask if meal coverage may be given via insulin pump. MD called back and stated patient may give SQ insulin via pump based on sliding scale.

## 2016-12-24 NOTE — Progress Notes (Signed)
This encounter was created in error - please disregard.

## 2016-12-24 NOTE — Progress Notes (Signed)
Pt hypertensive with systolic BP in the 190's. Hydralazine 10 mg given around midnight.(5mg  PRN dose given at 2245).MD aware.  BP now in the 170's systolic. Pt. also c/o headache & nausea. PRN Tylenol and Zofran given . RN will continue to monitor.

## 2016-12-24 NOTE — Progress Notes (Signed)
Discharge to home via family car, Discharge instructions discusses with patient and spouse. Patient states that she understands these instructions.

## 2016-12-24 NOTE — Discharge Summary (Signed)
Physician Discharge Summary  Amy Luna  ZOX:[id number]  DOB: 13-Jun-1955  DOA: 12/23/2016 PCP: Merri Brunette, MD  Admit date: 12/23/2016 Discharge date: 12/24/2016  Admitted From: Home  Disposition:  Home   Recommendations for Outpatient Follow-up:  1. Follow up with PCP in 1 weeks 2. Follow up with Endocrinologist  3. Please obtain BMP/CBC in one week  Discharge Condition: Stable  CODE STATUS: FULL  Diet recommendation: Heart Healthy / Carb Modified / Regular / Dysphagia   Brief/Interim Summary: 61 year old female with past medical history of diabetes on insulin pump, essential hypertension, who presented to the emergency department complaining of vomiting and elevated blood sugar up to 500. Patient took 14 units of Humalog but upon arrival to the ED CBGs were 674 with mild anion gap diabetic ketoacidosis. Patient was admitted to the stepdown unit for insulin drip, subsequently patient was place on her insulin pump with CBGs under better control. Anion gap was corrected, patient was started on oral diet and tolerated well. Blood pressure was mildly elevated due to aggressive IV fluids. After resuming her blood pressure medication BP improved. Patient will be discharged home to follow-up with PCP in 1 week.  Subjective: Patient seen and examined on the day of discharge. She has no complaint report that she wants to go home. She state that her insulin pump is working well, having a workup for insulin correctional home and blood pressure medications. As well, tolerating diet well. Ambulating without issues.  Discharge Diagnoses/Hospital Course:  Diabetes ketoacidosis Secondary to stop/mechanical compromise of insulin pump Anion gap has closed Insulin pump was checked, throat should and is working properly now. Resume home insulin regimen Follow-up with PCP  Hypertension During hospital stay BP was elevated, likely due to aggressive IV fluid hydration IV fluids were stopped and home  BP medications were some BP normalized No changes on her home medications were made. Follow-up with PCP in 1 week  Lactic acidosis - secondary to dehydration and DKA Resolved  On the day of the discharge the patient's vitals were stable, and no other acute medical condition were reported by patient. Patient was felt safe to be discharge to home  Discharge Instructions  You were cared for by a hospitalist during your hospital stay. If you have any questions about your discharge medications or the care you received while you were in the hospital after you are discharged, you can call the unit and asked to speak with the hospitalist on call if the hospitalist that took care of you is not available. Once you are discharged, your primary care physician will handle any further medical issues. Please note that NO REFILLS for any discharge medications will be authorized once you are discharged, as it is imperative that you return to your primary care physician (or establish a relationship with a primary care physician if you do not have one) for your aftercare needs so that they can reassess your need for medications and monitor your lab values.  Discharge Instructions    Call MD for:  difficulty breathing, headache or visual disturbances    Complete by:  As directed    Call MD for:  extreme fatigue    Complete by:  As directed    Call MD for:  hives    Complete by:  As directed    Call MD for:  persistant dizziness or light-headedness    Complete by:  As directed    Call MD for:  persistant nausea and vomiting    Complete  by:  As directed    Call MD for:  redness, tenderness, or signs of infection (pain, swelling, redness, odor or green/yellow discharge around incision site)    Complete by:  As directed    Call MD for:  severe uncontrolled pain    Complete by:  As directed    Call MD for:  temperature >100.4    Complete by:  As directed    Diet - low sodium heart healthy    Complete by:  As  directed    Increase activity slowly    Complete by:  As directed      Allergies as of 12/24/2016      Reactions   Other       Medication List    TAKE these medications   ALEVE PO Take 1 tablet by mouth daily as needed (pain).   amLODipine-benazepril 10-20 MG capsule Commonly known as:  LOTREL Take 1 capsule by mouth daily.   B-D ULTRAFINE III SHORT PEN 31G X 8 MM Misc Generic drug:  Insulin Pen Needle   GARCINIA CAMBOGIA-CHROMIUM PO Take 2 capsules by mouth daily.   multivitamin tablet Take 1 tablet by mouth daily.   NOVOLOG FLEXPEN 100 UNIT/ML FlexPen Generic drug:  insulin aspart   OMNIPOD 5 PACK Misc   ONE TOUCH ULTRA TEST test strip Generic drug:  glucose blood   SYSTANE ULTRA OP Apply 2 drops to eye daily as needed (dry eye).   TRULICITY 0.75 MG/0.5ML Sopn Generic drug:  Dulaglutide       Allergies  Allergen Reactions  . Other     Consultations:  None   Procedures/Studies: Dg Abd Acute W/chest  Result Date: 12/23/2016 CLINICAL DATA:  Apply see me a with nausea and vomiting. History of hypertension, former smoker. EXAM: DG ABDOMEN ACUTE W/ 1V CHEST COMPARISON:  Chest x-ray of May 29, 2013. FINDINGS: The lungs are adequately inflated and clear. The heart and pulmonary vascularity are normal. The mediastinum is normal in width. There is no pleural effusion. The bony thorax is unremarkable. Within the abdomen the bowel gas pattern is normal. There are numerous phleboliths within the pelvis. The lumbar spine exhibits no acute abnormality. IMPRESSION: No acute cardiopulmonary abnormality. No acute intra-abdominal abnormality. Electronically Signed   By: David  SwazilandJordan M.D.   On: 12/23/2016 12:46     Discharge Exam: Vitals:   12/24/16 1538 12/24/16 1541  BP: (!) 151/65   Pulse:    Resp:  (!) 22  Temp:     Vitals:   12/24/16 1400 12/24/16 1538 12/24/16 1541 12/24/16 1542  BP:  (!) 151/65    Pulse:      Resp: 20  (!) 22   Temp:      TempSrc:       SpO2:    100%  Weight:      Height:        General: Pt is alert, awake, not in acute distress Cardiovascular: RRR, S1/S2 +, no rubs, no gallops Respiratory: CTA bilaterally, no wheezing, no rhonchi Abdominal: Soft, NT, ND, bowel sounds + Extremities: no edema, no cyanosis   The results of significant diagnostics from this hospitalization (including imaging, microbiology, ancillary and laboratory) are listed below for reference.     Microbiology: Recent Results (from the past 240 hour(s))  MRSA PCR Screening     Status: None   Collection Time: 12/23/16  3:05 PM  Result Value Ref Range Status   MRSA by PCR NEGATIVE NEGATIVE Final  Comment:        The GeneXpert MRSA Assay (FDA approved for NASAL specimens only), is one component of a comprehensive MRSA colonization surveillance program. It is not intended to diagnose MRSA infection nor to guide or monitor treatment for MRSA infections.      Labs: BNP (last 3 results) No results for input(s): BNP in the last 8760 hours. Basic Metabolic Panel:  Recent Labs Lab 12/23/16 1004 12/23/16 1010 12/23/16 1605 12/24/16 0335  NA 134* 134* 140 138  K 4.5 4.6 4.4 3.6  CL 99* 100* 108 108  CO2 19*  --  23 22  GLUCOSE 647* 653* 230* 253*  BUN 23* 31* 18 15  CREATININE 1.18* 1.10* 0.80 0.81  CALCIUM 9.4  --  8.8* 8.5*   Liver Function Tests:  Recent Labs Lab 12/23/16 1004  AST 26  ALT 13*  ALKPHOS 60  BILITOT 1.1  PROT 7.3  ALBUMIN 4.3   No results for input(s): LIPASE, AMYLASE in the last 168 hours. No results for input(s): AMMONIA in the last 168 hours. CBC:  Recent Labs Lab 12/23/16 1004 12/23/16 1010 12/23/16 1605 12/24/16 0335  WBC 16.5*  --  11.5* 11.7*  NEUTROABS 13.4*  --   --   --   HGB 12.3 13.6 11.0* 10.5*  HCT 35.9* 40.0 32.4* 31.5*  MCV 88.9  --  89.0 89.7  PLT 202  --  184 185   Cardiac Enzymes: No results for input(s): CKTOTAL, CKMB, CKMBINDEX, TROPONINI in the last 168  hours. BNP: Invalid input(s): POCBNP CBG:  Recent Labs Lab 12/24/16 0733 12/24/16 0913 12/24/16 1149 12/24/16 1455 12/24/16 1559  GLUCAP 240* 185* 266* 283* 272*   D-Dimer No results for input(s): DDIMER in the last 72 hours. Hgb A1c No results for input(s): HGBA1C in the last 72 hours. Lipid Profile No results for input(s): CHOL, HDL, LDLCALC, TRIG, CHOLHDL, LDLDIRECT in the last 72 hours. Thyroid function studies No results for input(s): TSH, T4TOTAL, T3FREE, THYROIDAB in the last 72 hours.  Invalid input(s): FREET3 Anemia work up No results for input(s): VITAMINB12, FOLATE, FERRITIN, TIBC, IRON, RETICCTPCT in the last 72 hours. Urinalysis    Component Value Date/Time   COLORURINE YELLOW 12/23/2016 0956   APPEARANCEUR CLEAR 12/23/2016 0956   LABSPEC 1.023 12/23/2016 0956   PHURINE 5.0 12/23/2016 0956   GLUCOSEU >=500 (A) 12/23/2016 0956   HGBUR NEGATIVE 12/23/2016 0956   BILIRUBINUR NEGATIVE 12/23/2016 0956   KETONESUR 20 (A) 12/23/2016 0956   PROTEINUR NEGATIVE 12/23/2016 0956   UROBILINOGEN 0.2 09/29/2009 2104   NITRITE NEGATIVE 12/23/2016 0956   LEUKOCYTESUR NEGATIVE 12/23/2016 0956   Sepsis Labs Invalid input(s): PROCALCITONIN,  WBC,  LACTICIDVEN Microbiology Recent Results (from the past 240 hour(s))  MRSA PCR Screening     Status: None   Collection Time: 12/23/16  3:05 PM  Result Value Ref Range Status   MRSA by PCR NEGATIVE NEGATIVE Final    Comment:        The GeneXpert MRSA Assay (FDA approved for NASAL specimens only), is one component of a comprehensive MRSA colonization surveillance program. It is not intended to diagnose MRSA infection nor to guide or monitor treatment for MRSA infections.      Time coordinating discharge: 25 minutes  SIGNED:  Latrelle DodrillEdwin Silva, MD  [facility] 12/24/2016, 4:14 PM  Pager please text page via  [email or web address] Password [email or web address]

## 2016-12-24 NOTE — Care Management Note (Signed)
Case Management Note  Patient Details  Name: Amy Luna MRN: [id number] Date of Birth: 03/31/1956  Subjective/Objective:                  Hyperglycemia and responsive to iv insulin drip  Action/Plan: Date:  December 24, 2016 Chart reviewed for concurrent status and case management needs. Will continue to follow patient progress. Discharge Planning: following for needs Expected discharge date: [id number] Amy Luna, BSN, [place], [place]   [phone]  Expected Discharge Date:   (unknown)               Expected Discharge Plan:  Home/Self Care  In-House Referral:     Discharge planning Services  CM Consult  Post Acute Care Choice:    Choice offered to:     DME Arranged:    DME Agency:     HH Arranged:    HH Agency:     Status of Service:  In process, will continue to follow  If discussed at Long Length of Stay Meetings, dates discussed:    Additional Comments:  Amy Luna, Amy Bevens Lynn, RN 12/24/2016, 8:53 AM

## 2016-12-24 NOTE — Progress Notes (Signed)
Inpatient Diabetes Program Recommendations  AACE/ADA: New Consensus Statement on Inpatient Glycemic Control (2015)  Target Ranges:  Prepandial:   less than 140 mg/dL      Peak postprandial:   less than 180 mg/dL (1-2 hours)      Critically ill patients:  140 - 180 mg/dL   Lab Results  Component Value Date   GLUCAP 185 (H) 12/24/2016    Review of Glycemic Control  Diabetes history: DM 1 Outpatient Diabetes medications: Novolog in OmniPod insulin pump, Trulicity 0.75every Tuesday Current orders for Inpatient glycemic control: SQ regimen transitioning back to home insulin pump  Inpatient Diabetes Program Recommendations:    Saw patient in regards to her insulin pump and DKA. Patient reports she knew she needed to only have her pump off no longer than 1 hour, however she fell asleep and woke up feeling sick. Spoke with patient about her need for insulin 24/7. Patient understood. Patient reports she would just change her insulin pump site next time and not wait. Patient reports her glucose normaly being around 200 mg/dl. Patient see Endocrinologist Outpatient for DM control and was just started on the insulin pump last month in June. Dr. Randel PiggSilva Zapata Came into the room during my visit. Wants to trial her home insulin pump while here. Watch patient draw up insulin into her pump and placed her insulin pump site in the left lower quadrant. Patient is A&O to operate pump.  Glucose level before I left the room was 185 mg/dl.  Thanks,  Christena DeemShannon Stiles Maxcy RN, MSN, [facility] Inpatient Diabetes Coordinator Team Pager [phone] (8a-5p)

## 2016-12-25 ENCOUNTER — Telehealth: Payer: Self-pay | Admitting: *Deleted

## 2016-12-25 ENCOUNTER — Ambulatory Visit (AMBULATORY_SURGERY_CENTER): Payer: Self-pay | Admitting: *Deleted

## 2016-12-25 VITALS — Ht 66.0 in | Wt 201.0 lb

## 2016-12-25 DIAGNOSIS — Z1211 Encounter for screening for malignant neoplasm of colon: Secondary | ICD-10-CM

## 2016-12-25 MED ORDER — NA SULFATE-K SULFATE-MG SULF 17.5-3.13-1.6 GM/177ML PO SOLN: ORAL | 0 refills | Status: DC

## 2016-12-25 NOTE — Telephone Encounter (Signed)
Dr Myrtie Neitheranis: pt is scheduled for direct screening colonoscopy 8/15.  Pt is diabetic and was admitted to hospital 7/30 with DKA; discharged 7/31.  I saw her in [place] today and she says her blood sugar is under control today. Please review chart; is patient ok for direct colonoscopy as scheduled?  Thanks, Olegario MessierKathy in [facility]

## 2016-12-25 NOTE — Telephone Encounter (Signed)
Amy Luna: pt is scheduled for colonoscopy with Dr Myrtie Neitheranis on Wednesday 8/15 at 9:00 am.  Pt is diabetic on Insulin Pump; managed by Dr Merri BrunetteWalter Pharr.  Please send Insulin Pump letter to Dr Renne CriglerPharr concerning how to manage pump day before and day of procedure.  Thanks, [facility]

## 2016-12-25 NOTE — Progress Notes (Signed)
No allergies to eggs or soy. No problems with anesthesia.  Pt given Emmi instructions for colonoscopy  No oxygen use  No diet drug use  

## 2016-12-26 NOTE — Telephone Encounter (Signed)
Toni: Pt needs to be rescheduled for her colon until a later time. Please hold Insulin pump letter until pt has been rescheduled. See note from Dr Myrtie Neitheranis. Thanks

## 2016-12-26 NOTE — Telephone Encounter (Signed)
No, I think her routine colonoscopy should wait until she has a longer period of better glucose control.  Pre-colonoscopy dietary change may send her out of balance.  Please give her a date in mid September or beyond so that any adjustments by her endocrinologist can take effect and stabilize.

## 2016-12-26 NOTE — Telephone Encounter (Signed)
Called pt on mobile #, unable to leave message. Will try later.[facility], pt would need to be rescheduled and to hold Insulin pump letter.

## 2016-12-27 ENCOUNTER — Encounter: Payer: Self-pay | Admitting: Gastroenterology

## 2016-12-27 NOTE — Telephone Encounter (Signed)
LM to Return call- no call as of 1635 today Hilda LiasMarie PV

## 2016-12-30 NOTE — Telephone Encounter (Signed)
I spoke with the patient regarding cancelling her colonoscopy that was scheduled for 01/08/17. Patient agreed with Dr. Myrtie Neitheranis and said that she will call back in early September to reschedule her procedure after she has had time to recover from her hospitalization. Procedure was cancelled.   Janalee DaneNancy Kiylah Loyer, LPN

## 2017-01-08 ENCOUNTER — Encounter: Payer: BC Managed Care – PPO | Admitting: Gastroenterology

## 2017-11-17 ENCOUNTER — Encounter (INDEPENDENT_AMBULATORY_CARE_PROVIDER_SITE_OTHER): Payer: BC Managed Care – PPO | Admitting: Ophthalmology

## 2017-12-20 ENCOUNTER — Emergency Department (HOSPITAL_COMMUNITY): Payer: BC Managed Care – PPO

## 2017-12-20 ENCOUNTER — Encounter (HOSPITAL_COMMUNITY): Payer: Self-pay | Admitting: *Deleted

## 2017-12-20 ENCOUNTER — Emergency Department (HOSPITAL_COMMUNITY)
Admission: EM | Admit: 2017-12-20 | Discharge: 2017-12-21 | Disposition: A | Discharge status: Home / Self Care | Payer: BC Managed Care – PPO | Attending: Emergency Medicine | Admitting: Emergency Medicine

## 2017-12-20 DIAGNOSIS — R200 Anesthesia of skin: Secondary | ICD-10-CM | POA: Diagnosis not present

## 2017-12-20 DIAGNOSIS — R51 Headache: Secondary | ICD-10-CM | POA: Insufficient documentation

## 2017-12-20 DIAGNOSIS — R519 Headache, unspecified: Secondary | ICD-10-CM

## 2017-12-20 LAB — CBC WITH DIFFERENTIAL/PLATELET
BASOS PCT: 0 %
Basophils Absolute: 0 10*3/uL (ref 0.0–0.1)
Eosinophils Absolute: 0.2 10*3/uL (ref 0.0–0.7)
Eosinophils Relative: 2 %
HEMATOCRIT: 38.6 % (ref 36.0–46.0)
HEMOGLOBIN: 12.6 g/dL (ref 12.0–15.0)
Lymphocytes Relative: 47 %
Lymphs Abs: 2.9 10*3/uL (ref 0.7–4.0)
MCH: 30.2 pg (ref 26.0–34.0)
MCHC: 32.6 g/dL (ref 30.0–36.0)
MCV: 92.6 fL (ref 78.0–100.0)
MONO ABS: 0.5 10*3/uL (ref 0.1–1.0)
Monocytes Relative: 7 %
NEUTROS ABS: 2.7 10*3/uL (ref 1.7–7.7)
NEUTROS PCT: 44 %
Platelets: 229 10*3/uL (ref 150–400)
RBC: 4.17 MIL/uL (ref 3.87–5.11)
RDW: 13 % (ref 11.5–15.5)
WBC: 6.2 10*3/uL (ref 4.0–10.5)

## 2017-12-20 LAB — COMPREHENSIVE METABOLIC PANEL
ALBUMIN: 4.1 g/dL (ref 3.5–5.0)
ALT: 11 U/L (ref 0–44)
AST: 19 U/L (ref 15–41)
Alkaline Phosphatase: 55 U/L (ref 38–126)
Anion gap: 12 (ref 5–15)
BILIRUBIN TOTAL: 1.4 mg/dL — AB (ref 0.3–1.2)
BUN: 16 mg/dL (ref 8–23)
CO2: 23 mmol/L (ref 22–32)
Calcium: 9.6 mg/dL (ref 8.9–10.3)
Chloride: 99 mmol/L (ref 98–111)
Creatinine, Ser: 0.91 mg/dL (ref 0.44–1.00)
GFR calc Af Amer: >60 mL/min (ref >60)
GFR calc non Af Amer: >60 mL/min (ref >60)
GLUCOSE: 283 mg/dL — AB (ref 70–99)
POTASSIUM: 4 mmol/L (ref 3.5–5.1)
Sodium: 134 mmol/L — ABNORMAL LOW (ref 135–145)
TOTAL PROTEIN: 7.8 g/dL (ref 6.5–8.1)

## 2017-12-20 LAB — TROPONIN I

## 2017-12-20 NOTE — ED Notes (Signed)
Pt. Ambulatory from Triage.

## 2017-12-20 NOTE — ED Triage Notes (Signed)
Pt c/o headache and left arm numbness x 2 days. Pt had seizure 3 night ago. Pt went to PCP, was referred to neurologist. Pt last had seizure 30 years ago. No facial droop, arm drift, slurred speech noted in triage.

## 2017-12-21 ENCOUNTER — Other Ambulatory Visit: Payer: Self-pay

## 2017-12-21 NOTE — ED Provider Notes (Signed)
[facility] DEPT Provider Note   CSN: [id number] Arrival date & time: 12/20/17  1657     History   Chief Complaint Chief Complaint  Patient presents with  . Headache  . Numbness    HPI Amy Luna is a 62 y.o. female.  The history is provided by the patient.  Headache   This is a new problem. The current episode started more than 2 days ago. The problem occurs constantly. The problem has been gradually improving. The headache is associated with nothing. The pain is moderate. She has tried nothing for the symptoms.    Past Medical History:  Diagnosis Date  . Diabetes mellitus ([place])   . Hypertension     Patient Active Problem List   Diagnosis Date Noted  . DKA (diabetic ketoacidoses) ([place]) 12/23/2016  . Essential hypertension 12/23/2016    Past Surgical History:  Procedure Laterality Date  . COLONOSCOPY  2008   internal hemorrhoids; otherwise normal.  Dr Lajoyce Corners  . no prior surgery       OB History   None      Home Medications    Prior to Admission medications   Medication Sig Start Date End Date Taking? Authorizing Provider  amLODipine-benazepril (LOTREL) 10-20 MG capsule Take 1 capsule by mouth daily. 11/26/16   [provider]  B-D ULTRAFINE III SHORT PEN 31G X 8 MM MISC  11/08/11   [provider]  GARCINIA CAMBOGIA-CHROMIUM PO Take 2 capsules by mouth daily.     [provider]  Insulin Disposable Pump (OMNIPOD 5 PACK) MISC  11/07/16   [provider]  Insulin Lispro (HUMALOG [place]) Inject into the skin as directed. Sliding scale    [provider]  Multiple Vitamin (MULTIVITAMIN) tablet Take 1 tablet by mouth daily.    [provider]  Na Sulfate-K Sulfate-Mg Sulf (SUPREP BOWEL PREP KIT) 17.5-3.13-1.6 GM/180ML SOLN suprep as directed.  No substitutions 12/25/16   Doran Stabler, MD  Naproxen Sodium (ALEVE PO) Take 1 tablet by mouth daily as needed (pain).    [provider]  NOVOLOG FLEXPEN 100 UNIT/ML FlexPen  09/12/16   [provider]  ONE TOUCH ULTRA TEST test strip  11/07/11   [provider]  Polyethyl Glycol-Propyl Glycol (SYSTANE ULTRA OP) Apply 2 drops to eye daily as needed (dry eye).    [provider]  Sennosides (SENOKOT PO) Take by mouth daily.    [provider]  TRULICITY 0.25 [place]/7.0WC SOPN  11/07/16   [provider]  vitamin E 100 UNIT capsule Take by mouth daily.    [provider]    Family History Family History  Problem Relation Age of Onset  . Diabetes Mother   . Hypertension Mother   . Colon cancer Neg Hx   . Stomach cancer Neg Hx     Social History Social History   Tobacco Use  . Smoking status: Former Smoker    Last attempt to quit: 01/31/1979    Years since quitting: 38.9  . Smokeless tobacco: Never Used  Substance Use Topics  . Alcohol use: Yes    Comment: rare  . Drug use: No     Allergies   Patient has no known allergies.   Review of Systems Review of Systems  Neurological: Positive for headaches.  All other systems reviewed and are negative.    Physical Exam Updated Vital Signs BP (!) 142/74 (BP Location: Left Arm)   Pulse 77  Temp 98.1 F (36.7 C) (Oral)   Resp 16   Ht _0  (1.676 m)   Wt 87.5 kg (193 lb)   SpO2 100%   BMI 31.15 kg/m   Physical Exam  Constitutional: She is oriented to person, place, and time. She appears well-developed and well-nourished.  HENT:  Head: Normocephalic and atraumatic.  Eyes: Pupils are equal, round, and reactive to light.  Neck: Normal range of motion.  Cardiovascular: Normal rate and regular rhythm.  Pulmonary/Chest: No stridor. No respiratory distress.  Abdominal: She exhibits no distension.  Musculoskeletal: Normal range of motion.  Neurological: She is alert and oriented to person, place, and time. She has normal strength. She is not disoriented. She displays normal reflexes. No cranial  nerve deficit or sensory deficit. Coordination and gait normal. GCS eye subscore is 4. GCS verbal subscore is 5. GCS motor subscore is 6.  Skin: Skin is warm and dry.  Nursing note and vitals reviewed.    ED Treatments / Results  Labs (all labs ordered are listed, but only abnormal results are displayed) Labs Reviewed  COMPREHENSIVE METABOLIC PANEL - Abnormal; Notable for the following components:      Result Value   Sodium 134 (*)    Glucose, Bld 283 (*)    Total Bilirubin 1.4 (*)    All other components within normal limits  CBC WITH DIFFERENTIAL/PLATELET  TROPONIN I    EKG None  Radiology Ct Head Wo Contrast  Result Date: 12/20/2017 CLINICAL DATA:  Headache, left arm numbness. EXAM: CT HEAD WITHOUT CONTRAST TECHNIQUE: Contiguous axial images were obtained from the base of the skull through the vertex without intravenous contrast. COMPARISON:  None. FINDINGS: Brain: Mild age related volume loss. No acute intracranial abnormality. Specifically, no hemorrhage, hydrocephalus, mass lesion, acute infarction, or significant intracranial injury. Vascular: No hyperdense vessel or unexpected calcification. Skull: No acute calvarial abnormality. Sinuses/Orbits: Visualized paranasal sinuses and mastoids clear. Orbital soft tissues unremarkable. Other: None IMPRESSION: No acute intracranial abnormality. Electronically Signed   By: Rolm Baptise M.D.   On: 12/20/2017 22:35    Procedures Procedures (including critical care time)  Medications Ordered in ED Medications - No data to display   Initial Impression / Assessment and Plan / ED Course  I have reviewed the triage vital signs and the nursing notes.  Pertinent labs & imaging results that were available during my care of the patient were reviewed by me and considered in my medical decision making (see chart for details).     Low suspicion for stroke.  Troponin negative doubt ACS.  Could be a post seizure Todd's paralysis.  Could be  anxiety however less likely.  She is a little bit hypertensive so could be related to that as well.  She will continue follow with neurology and her primary doctor which is her scheduled.  Final Clinical Impressions(s) / ED Diagnoses   Final diagnoses:  Nonintractable headache, unspecified chronicity pattern, unspecified headache type    ED Discharge Orders    None       Rayleen Wyrick, Corene Cornea, MD 12/21/17 [phone]

## 2017-12-24 ENCOUNTER — Encounter: Payer: Self-pay | Admitting: *Deleted

## 2017-12-26 ENCOUNTER — Encounter: Payer: Self-pay | Admitting: Diagnostic Neuroimaging

## 2017-12-26 ENCOUNTER — Ambulatory Visit: Payer: BC Managed Care – PPO | Admitting: Diagnostic Neuroimaging

## 2017-12-26 VITALS — BP 157/82 | HR 65 | Ht 65.5 in | Wt 190.4 lb

## 2017-12-26 DIAGNOSIS — G40909 Epilepsy, unspecified, not intractable, without status epilepticus: Secondary | ICD-10-CM

## 2017-12-26 DIAGNOSIS — G459 Transient cerebral ischemic attack, unspecified: Secondary | ICD-10-CM

## 2017-12-26 MED ORDER — LEVETIRACETAM 500 MG PO TABS: 500.0000 mg | ORAL_TABLET | Freq: Two times a day (BID) | ORAL | 12 refills | Status: DC

## 2017-12-26 NOTE — Patient Instructions (Addendum)
NEW ONSET SEIZURE - check MRI brain and EEG  - start levetiracetam 500mg  twice a day  - According to [place] law, you can not drive unless you are seizure / syncope free for at least 6 months and under physician's care.   - Please maintain precautions. Do not participate in activities where a loss of awareness could harm you or someone else. No swimming alone, no tub bathing, no hot tubs, no driving, no operating motorized vehicles (cars, ATVs, motocycles, etc), lawnmowers, power tools or firearms. No standing at heights, such as rooftops, ladders or stairs. Avoid hot objects such as stoves, heaters, open fires. Wear a helmet when riding a bicycle, scooter, skateboard, etc. and avoid areas of traffic. Set your water heater to 120 degrees or less.    TIA / STROKE WORKUP - stroke risk factor mgmt (aspirin 81mg  daily, BP, diabetes)  - carotid ultrasound  - TTE (echocardiogram)

## 2017-12-26 NOTE — Progress Notes (Signed)
[facility]  PATIENT: Amy Luna DOB: [birth year]  REFERRING CLINICIAN: Mady Gemma, MD HISTORY FROM: patient  REASON FOR VISIT: new consult    HISTORICAL  CHIEF COMPLAINT:  Chief Complaint  Patient presents with  . New Patient (Initial Visit)    Referred by Dr. Janie Morning  . Seizures    Patient hasn't had a seizure in 30+ years, she is not on any medication for them.     HISTORY OF PRESENT ILLNESS:   62 year old female here for evaluation of seizure.  History of hypertension diabetes.  Patient has insulin pump.  In the 1990s patient had episode of seizure, unprovoked, and was treated with antiseizure medication for 1 month.  She stopped medication and then was recommended to monitor symptoms.  Patient did well for many years.  12/17/2017 patient was in [place] with a family member, when all of a sudden she had nausea.  She then lost consciousness and had left-sided shaking.  Eyes rolled back.  She lost control of urine.  Seizure lasted approximately 10 seconds.  Afterwards patient was very tired, drained of energy and groggy.  Blood sugar was 114.  She felt some numbness on the left side of her body.  12/20/2017 patient had increasing headaches and left-sided numbness.  Patient went to the emergency room for evaluation.  CT scan head was unremarkable.  Lab testing unremarkable.  Patient was then referred to neurology clinic for further follow-up.  Since that time patient is doing well.  She continues to have numbness in her left hand.  No recurrent seizures.  No other specific triggering or aggravating factors.  Since June 2019 patient has been on medication Trulicity, and has been having increasing nausea.  Patient has stopped this medication the last 1 week and feels better.    REVIEW OF SYSTEMS: Full 14 system review of systems performed and negative with exception of: Constipation numbness weakness seizure hypertension diabetes.  ALLERGIES: No  Known Allergies  HOME MEDICATIONS: Outpatient Medications Prior to Visit  Medication Sig Dispense Refill  . amLODipine-benazepril (LOTREL) 10-20 MG capsule Take 1 capsule by mouth daily.    . B-D ULTRAFINE III SHORT PEN 31G X 8 MM MISC     . GARCINIA CAMBOGIA-CHROMIUM PO Take 2 capsules by mouth daily.     . Insulin Disposable Pump (OMNIPOD 5 PACK) MISC     . Insulin Lispro (HUMALOG [place]) Inject into the skin as directed. Sliding scale    . Multiple Vitamin (MULTIVITAMIN) tablet Take 1 tablet by mouth daily.    . Na Sulfate-K Sulfate-Mg Sulf (SUPREP BOWEL PREP KIT) 17.5-3.13-1.6 GM/180ML SOLN suprep as directed.  No substitutions 354 mL 0  . Naproxen Sodium (ALEVE PO) Take 1 tablet by mouth daily as needed (pain).    . NOVOLOG FLEXPEN 100 UNIT/ML FlexPen     . ONE TOUCH ULTRA TEST test strip     . Polyethyl Glycol-Propyl Glycol (SYSTANE ULTRA OP) Apply 2 drops to eye daily as needed (dry eye).    . Sennosides (SENOKOT PO) Take by mouth daily.    . vitamin E 100 UNIT capsule Take by mouth daily.    . TRULICITY 9.24 QA/8.3MH SOPN      No facility-administered medications prior to visit.     PAST MEDICAL HISTORY: Past Medical History:  Diagnosis Date  . Diabetes mellitus ([place])   . Diabetic retinopathy ([place])   . GERD (gastroesophageal reflux disease)   . History of shingles   . Hyperlipidemia   .  Hypertension   . Seizure ([facility])   . Toxic goiter     PAST SURGICAL HISTORY: Past Surgical History:  Procedure Laterality Date  . BUNIONECTOMY    . COLONOSCOPY  2008   internal hemorrhoids; otherwise normal.  Dr Orr  . no prior surgery      FAMILY HISTORY: Family History  Problem Relation Age of Onset  . Diabetes Mother   . Hypertension Mother   . Breast cancer Mother   . Diabetes Father   . Stroke Father   . Breast cancer Sister   . Skin cancer Brother   . Colon cancer Neg Hx   . Stomach cancer Neg Hx     SOCIAL HISTORY: Social History   Socioeconomic History  .  Marital status: Single    Spouse name: Not on file  . Number of children: Not on file  . Years of education: Not on file  . Highest education level: Not on file  Occupational History  . Occupation: Middle School Teacher    Employer: [employer]  Social Needs  . Financial resource strain: Not on file  . Food insecurity:    Worry: Not on file    Inability: Not on file  . Transportation needs:    Medical: Not on file    Non-medical: Not on file  Tobacco Use  . Smoking status: Former Smoker    Last attempt to quit: 01/31/1979    Years since quitting: 38.9  . Smokeless tobacco: Never Used  Substance and Sexual Activity  . Alcohol use: Not Currently    Comment: rare  . Drug use: No  . Sexual activity: Not on file  Lifestyle  . Physical activity:    Days per week: Not on file    Minutes per session: Not on file  . Stress: Not on file  Relationships  . Social connections:    Talks on phone: Not on file    Gets together: Not on file    Attends religious service: Not on file    Active member of club or organization: Not on file    Attends meetings of clubs or organizations: Not on file    Relationship status: Not on file  . Intimate partner violence:    Fear of current or ex partner: Not on file    Emotionally abused: Not on file    Physically abused: Not on file    Forced sexual activity: Not on file  Other Topics Concern  . Not on file  Social History Narrative   Lives at home alone.   Education level: Masters     PHYSICAL EXAM  GENERAL EXAM/CONSTITUTIONAL: Vitals:  Vitals:   12/26/17 1047  BP: (!) 157/82  Pulse: 65  Weight: 190 lb 6.4 oz (86.4 kg)  Height: 5' 5.5" (1.664 m)     Body mass index is 31.2 kg/m. Wt Readings from Last 3 Encounters:  12/26/17 190 lb 6.4 oz (86.4 kg)  12/21/17 193 lb (87.5 kg)  12/25/16 201 lb (91.2 kg)     Patient is in no distress; well developed, nourished and groomed; neck is  supple  CARDIOVASCULAR:  Examination of carotid arteries is normal; no carotid bruits  Regular rate and rhythm, no murmurs  Examination of peripheral vascular system by observation and palpation is normal  EYES:  Ophthalmoscopic exam of optic discs and posterior segments is normal; no papilledema or hemorrhages  No exam data present  MUSCULOSKELETAL:  Gait, strength, tone, movements noted in Neurologic   exam below  NEUROLOGIC: MENTAL STATUS:  No flowsheet data found.  awake, alert, oriented to person, place and time  recent and remote memory intact  normal attention and concentration  language fluent, comprehension intact, naming intact  fund of knowledge appropriate  CRANIAL NERVE:   2nd - no papilledema on fundoscopic exam  2nd, 3rd, 4th, 6th - pupils equal and reactive to light, visual fields full to confrontation, extraocular muscles intact, no nystagmus  5th - facial sensation symmetric  7th - facial strength symmetric  8th - hearing intact  9th - palate elevates symmetrically, uvula midline  11th - shoulder shrug symmetric  12th - tongue protrusion midline  MOTOR:   normal bulk and tone, full strength in the BUE, BLE  SENSORY:   normal and symmetric to light touch, pinprick, temperature, vibration; EXCEPT SLIGHT DECR PP IN FEET  COORDINATION:   finger-nose-finger, fine finger movements normal  REFLEXES:   deep tendon reflexes TRACE and symmetric  GAIT/STATION:   narrow based gait     DIAGNOSTIC DATA (LABS, IMAGING, TESTING) - I reviewed patient records, labs, notes, testing and imaging myself where available.  Lab Results  Component Value Date   WBC 6.2 12/20/2017   HGB 12.6 12/20/2017   HCT 38.6 12/20/2017   MCV 92.6 12/20/2017   PLT 229 12/20/2017      Component Value Date/Time   NA 134 (L) 12/20/2017 2139   K 4.0 12/20/2017 2139   CL 99 12/20/2017 2139   CO2 23 12/20/2017 2139   GLUCOSE 283 (H) 12/20/2017 2139   BUN  16 12/20/2017 2139   CREATININE 0.91 12/20/2017 2139   CALCIUM 9.6 12/20/2017 2139   PROT 7.8 12/20/2017 2139   ALBUMIN 4.1 12/20/2017 2139   AST 19 12/20/2017 2139   ALT 11 12/20/2017 2139   ALKPHOS 55 12/20/2017 2139   BILITOT 1.4 (H) 12/20/2017 2139   GFRNONAA >60 12/20/2017 2139   GFRAA >60 12/20/2017 2139   No results found for: CHOL, HDL, LDLCALC, LDLDIRECT, TRIG, CHOLHDL No results found for: HGBA1C No results found for: VITAMINB12 No results found for: TSH   12/20/17 CT head  - negative     ASSESSMENT AND PLAN  62 y.o. year old female here with new onset seizure on 12/17/2017.  Seizure had focal findings with left-sided convulsions and numbness.  Patient also had a seizure in 1990, unprovoked.  Unclear whether these 2 seizures are connected or not.  Also she has stroke risk factors and therefore this recent event could have been a TIA / stroke associated with seizure.   Dx:  1. Seizure disorder ([place])   2. TIA (transient ischemic attack)       PLAN:  NEW ONSET SEIZURE (new problem, addl workup) - check MRI brain and EEG  - start levetiracetam 568m twice a day (had 1st seizure ~1990's; now with focal symptoms with 2nd seizure)  - According to [place] law, you can not drive unless you are seizure / syncope free for at least 6 months and under physician's care.   - Please maintain precautions. Do not participate in activities where a loss of awareness could harm you or someone else. No swimming alone, no tub bathing, no hot tubs, no driving, no operating motorized vehicles (cars, ATVs, motocycles, etc), lawnmowers, power tools or firearms. No standing at heights, such as rooftops, ladders or stairs. Avoid hot objects such as stoves, heaters, open fires. Wear a helmet when riding a bicycle, scooter, skateboard, etc. and avoid areas  of traffic. Set your water heater to 120 degrees or less.    TIA / STROKE WORKUP (new problem, addl workup) - stroke risk factor mgmt  (aspirin 81mg daily, BP, diabetes) - carotid u/s - TTE  Orders Placed This Encounter  Procedures  . MR BRAIN W WO CONTRAST  . ECHOCARDIOGRAM COMPLETE  . EEG adult   Meds ordered this encounter  Medications  . levETIRAcetam (KEPPRA) 500 MG tablet    Sig: Take 1 tablet (500 mg total) by mouth 2 (two) times daily.    Dispense:  60 tablet    Refill:  12   Return in about 6 months (around 06/28/2018).  I reviewed images, labs, notes, records myself. I summarized findings and reviewed with patient, for this high risk condition (seizure) requiring high complexity decision making.    VIKRAM R. PENUMALLI, MD 12/26/2017, 11:24 AM Certified in Neurology, Neurophysiology and Neuroimaging  [facility] [address], [address] [place], [place] [address] [phone]  

## 2017-12-29 ENCOUNTER — Other Ambulatory Visit: Payer: BC Managed Care – PPO

## 2017-12-30 ENCOUNTER — Telehealth: Payer: Self-pay | Admitting: Diagnostic Neuroimaging

## 2017-12-30 NOTE — Telephone Encounter (Signed)
Patient is returning your call.  

## 2017-12-30 NOTE — Telephone Encounter (Signed)
lvm for pt to call back about scheduling mri   [employer] Auth: [id number] (exp. 12/29/17 to 01/27/18)

## 2017-12-30 NOTE — Telephone Encounter (Signed)
I spoke to the patient and informed her it would be about $328.85 for her to have the MRI done. She informed her she will have to call her insurance and see because she does not have that kind of money.. I did offer the payment plan with her.

## 2017-12-31 ENCOUNTER — Ambulatory Visit (HOSPITAL_COMMUNITY): Payer: BC Managed Care – PPO | Attending: Cardiology

## 2017-12-31 ENCOUNTER — Other Ambulatory Visit: Payer: Self-pay

## 2017-12-31 DIAGNOSIS — E119 Type 2 diabetes mellitus without complications: Secondary | ICD-10-CM | POA: Diagnosis not present

## 2017-12-31 DIAGNOSIS — E785 Hyperlipidemia, unspecified: Secondary | ICD-10-CM | POA: Diagnosis not present

## 2017-12-31 DIAGNOSIS — G459 Transient cerebral ischemic attack, unspecified: Secondary | ICD-10-CM | POA: Diagnosis not present

## 2017-12-31 DIAGNOSIS — G40909 Epilepsy, unspecified, not intractable, without status epilepticus: Secondary | ICD-10-CM | POA: Diagnosis not present

## 2017-12-31 DIAGNOSIS — I10 Essential (primary) hypertension: Secondary | ICD-10-CM | POA: Insufficient documentation

## 2017-12-31 NOTE — Telephone Encounter (Signed)
Patient called in today. Amy Luna wanted to schedule her MRI Amy Luna is scheduled for next Tues. 01/06/18.. Amy Luna informed me that Amy Luna would like a call back from a nurse because Amy Luna does not understand why Dr. Marjory LiesPenumalli has order her to have a MRI, EEG, Echo and Carotid. Amy Luna stated Amy Luna needs someone to explain to her why all these have been order.

## 2017-12-31 NOTE — Telephone Encounter (Signed)
We are evaluating her recent symptoms (to see if it was seizure or TIA / stroke or both). That is why we are ordering testing. -VRP

## 2017-12-31 NOTE — Telephone Encounter (Signed)
LVM requesting patient call back to discuss her questions.

## 2018-01-01 ENCOUNTER — Ambulatory Visit (HOSPITAL_COMMUNITY)
Admission: RE | Admit: 2018-01-01 | Discharge: 2018-01-01 | Disposition: A | Discharge status: Home / Self Care | Payer: BC Managed Care – PPO | Source: Ambulatory Visit | Attending: Vascular Surgery | Admitting: Vascular Surgery

## 2018-01-01 DIAGNOSIS — G40909 Epilepsy, unspecified, not intractable, without status epilepticus: Secondary | ICD-10-CM

## 2018-01-01 DIAGNOSIS — G459 Transient cerebral ischemic attack, unspecified: Secondary | ICD-10-CM

## 2018-01-01 DIAGNOSIS — I6523 Occlusion and stenosis of bilateral carotid arteries: Secondary | ICD-10-CM | POA: Diagnosis not present

## 2018-01-01 NOTE — Telephone Encounter (Signed)
Spoke with patient to advise her Dr Marjory LiesPenumalli has ordered tests to evaluate her recent symptoms for possible seizure, TIA or both. Advised her that her echocardiogram was an uUnremarkable study with no major findings. Her MRI and EEG are next week, and advised she'll get a call with those results when available. She verbalized understanding, appreciation.

## 2018-01-05 ENCOUNTER — Telehealth: Payer: Self-pay | Admitting: *Deleted

## 2018-01-05 ENCOUNTER — Other Ambulatory Visit: Payer: BC Managed Care – PPO

## 2018-01-05 NOTE — Telephone Encounter (Signed)
LVM informing patient that her carotid US results are unremarkable, no major stenosis. Advised her when other results are available she will get calls. Left number for any questions.

## 2018-01-06 ENCOUNTER — Ambulatory Visit: Payer: BC Managed Care – PPO

## 2018-01-06 ENCOUNTER — Other Ambulatory Visit: Payer: BC Managed Care – PPO

## 2018-01-06 DIAGNOSIS — G40909 Epilepsy, unspecified, not intractable, without status epilepticus: Secondary | ICD-10-CM | POA: Diagnosis not present

## 2018-01-06 DIAGNOSIS — G459 Transient cerebral ischemic attack, unspecified: Secondary | ICD-10-CM | POA: Diagnosis not present

## 2018-01-06 MED ORDER — GADOPENTETATE DIMEGLUMINE 469.01 MG/ML IV SOLN
18.0000 mL | Freq: Once | INTRAVENOUS | Status: AC | PRN
  Administered 2018-01-06: 18 mL via INTRAVENOUS

## 2018-01-07 ENCOUNTER — Other Ambulatory Visit: Payer: BC Managed Care – PPO

## 2018-01-09 ENCOUNTER — Telehealth: Payer: Self-pay | Admitting: *Deleted

## 2018-01-09 NOTE — Telephone Encounter (Signed)
LVM informing patient her MRI brain results are unremarkable, no concerning findings. Advised she continue taking medication as Dr Marjory LiesPenumalli prescribed, no driving [place] [facility] [place]. Reminded her of EEG appointment on 01/13/18. Advised this office is now closed, reopens Mon, advised she call then for any questions, and left number.

## 2018-01-13 ENCOUNTER — Other Ambulatory Visit: Payer: BC Managed Care – PPO

## 2018-01-22 ENCOUNTER — Other Ambulatory Visit: Payer: BC Managed Care – PPO

## 2018-07-06 ENCOUNTER — Ambulatory Visit: Payer: BC Managed Care – PPO | Admitting: Diagnostic Neuroimaging

## 2018-10-08 ENCOUNTER — Telehealth: Payer: Self-pay | Admitting: *Deleted

## 2018-10-08 NOTE — Telephone Encounter (Signed)
Called patient to discuss due to current COVID 19 pandemic, our office is severely reducing in person visits in order to minimize the risk to our patients and healthcare providers. We recommend to convert your appointment to a video visit. We'll take all precautions to reduce any security or privacy concerns. This will be treated like an office visit, and we will file with your insurance. She consented to video visit, e mail: [email or web address] . Moved FU sooner, Updated EMR. She stated she only took Keppra x 1 and stated "I've not had any problems since". She denied side effects just stated she stopped taking.  E mail sent.

## 2018-10-15 ENCOUNTER — Encounter

## 2018-10-15 ENCOUNTER — Other Ambulatory Visit: Payer: Self-pay

## 2018-10-15 ENCOUNTER — Ambulatory Visit (INDEPENDENT_AMBULATORY_CARE_PROVIDER_SITE_OTHER): Payer: Self-pay | Admitting: Diagnostic Neuroimaging

## 2018-10-15 DIAGNOSIS — Z0289 Encounter for other administrative examinations: Secondary | ICD-10-CM

## 2018-10-15 NOTE — Telephone Encounter (Signed)
LVM advising that she call back and reschedule video visit as it appears she and Dr Marjory Lies didn't connect today.

## 2018-11-02 ENCOUNTER — Ambulatory Visit: Payer: BC Managed Care – PPO | Admitting: Diagnostic Neuroimaging

## 2019-07-24 ENCOUNTER — Ambulatory Visit: Payer: BC Managed Care – PPO | Attending: Internal Medicine

## 2019-07-24 DIAGNOSIS — Z23 Encounter for immunization: Secondary | ICD-10-CM | POA: Insufficient documentation

## 2019-07-24 NOTE — Progress Notes (Signed)
   Covid-19 Vaccination Clinic  Name:  Amy Luna    MRN: [id number] DOB: [birth year]  07/24/2019  Amy Luna was observed post Covid-19 immunization for 15 minutes without incidence. She was provided with Vaccine Information Sheet and instruction to access the V-Safe system.   Amy Luna was instructed to call 911 with any severe reactions post vaccine: Marland Kitchen Difficulty breathing  . Swelling of your face and throat  . A fast heartbeat  . A bad rash all over your body  . Dizziness and weakness    Immunizations Administered    Name Date Dose VIS Date Route   Pfizer COVID-19 Vaccine 07/24/2019  3:18 PM 0.3 mL 05/07/2019 Intramuscular   Manufacturer: [employer], [employer]   Lot: [id number]   NDC: [address]-3

## 2019-08-14 ENCOUNTER — Ambulatory Visit: Payer: BC Managed Care – PPO | Attending: Internal Medicine

## 2019-08-14 DIAGNOSIS — Z23 Encounter for immunization: Secondary | ICD-10-CM

## 2019-08-14 NOTE — Progress Notes (Signed)
   Covid-19 Vaccination Clinic  Name:  Amy Luna    MRN: [id number] DOB: 11-26-1955  08/14/2019  [name] was observed post Covid-19 immunization for 15 minutes without incident. She was provided with Vaccine Information Sheet and instruction to access the V-Safe system.   [name] was instructed to call 911 with any severe reactions post vaccine: Marland Kitchen Difficulty breathing  . Swelling of face and throat  . A fast heartbeat  . A bad rash all over body  . Dizziness and weakness   Immunizations Administered    Name Date Dose VIS Date Route   Pfizer COVID-19 Vaccine 08/14/2019  4:24 PM 0.3 mL 05/07/2019 Intramuscular   Manufacturer: [employer], [employer]   Lot: [id number]   NDC: [address]-7

## 2020-01-04 ENCOUNTER — Other Ambulatory Visit: Payer: Self-pay | Admitting: Internal Medicine

## 2020-01-04 DIAGNOSIS — R222 Localized swelling, mass and lump, trunk: Secondary | ICD-10-CM

## 2020-01-06 ENCOUNTER — Other Ambulatory Visit: Payer: BC Managed Care – PPO

## 2020-01-06 ENCOUNTER — Ambulatory Visit
Admission: RE | Admit: 2020-01-06 | Discharge: 2020-01-06 | Disposition: A | Discharge status: Home / Self Care | Payer: Self-pay | Source: Ambulatory Visit | Attending: Internal Medicine | Admitting: Internal Medicine

## 2020-01-06 DIAGNOSIS — R222 Localized swelling, mass and lump, trunk: Secondary | ICD-10-CM

## 2020-01-26 ENCOUNTER — Other Ambulatory Visit: Payer: Self-pay | Admitting: Surgery

## 2020-04-05 DIAGNOSIS — E139 Other specified diabetes mellitus without complications: Secondary | ICD-10-CM | POA: Diagnosis not present

## 2020-04-05 DIAGNOSIS — R3581 Nocturnal polyuria: Secondary | ICD-10-CM | POA: Diagnosis not present

## 2020-04-05 DIAGNOSIS — Z794 Long term (current) use of insulin: Secondary | ICD-10-CM | POA: Diagnosis not present

## 2020-04-05 DIAGNOSIS — R300 Dysuria: Secondary | ICD-10-CM | POA: Diagnosis not present

## 2020-04-05 DIAGNOSIS — E7800 Pure hypercholesterolemia, unspecified: Secondary | ICD-10-CM | POA: Diagnosis not present

## 2020-04-05 DIAGNOSIS — I10 Essential (primary) hypertension: Secondary | ICD-10-CM | POA: Diagnosis not present

## 2020-04-24 DIAGNOSIS — E139 Other specified diabetes mellitus without complications: Secondary | ICD-10-CM | POA: Diagnosis not present

## 2020-04-24 DIAGNOSIS — I10 Essential (primary) hypertension: Secondary | ICD-10-CM | POA: Diagnosis not present

## 2020-04-24 DIAGNOSIS — E1165 Type 2 diabetes mellitus with hyperglycemia: Secondary | ICD-10-CM | POA: Diagnosis not present

## 2020-04-24 DIAGNOSIS — E7800 Pure hypercholesterolemia, unspecified: Secondary | ICD-10-CM | POA: Diagnosis not present

## 2020-04-26 DIAGNOSIS — I10 Essential (primary) hypertension: Secondary | ICD-10-CM | POA: Diagnosis not present

## 2020-04-26 DIAGNOSIS — E1165 Type 2 diabetes mellitus with hyperglycemia: Secondary | ICD-10-CM | POA: Diagnosis not present

## 2020-04-26 DIAGNOSIS — E559 Vitamin D deficiency, unspecified: Secondary | ICD-10-CM | POA: Diagnosis not present

## 2020-04-26 DIAGNOSIS — E7800 Pure hypercholesterolemia, unspecified: Secondary | ICD-10-CM | POA: Diagnosis not present

## 2020-05-08 DIAGNOSIS — Z6829 Body mass index (BMI) 29.0-29.9, adult: Secondary | ICD-10-CM | POA: Diagnosis not present

## 2020-05-08 DIAGNOSIS — Z1231 Encounter for screening mammogram for malignant neoplasm of breast: Secondary | ICD-10-CM | POA: Diagnosis not present

## 2020-05-08 DIAGNOSIS — Z01419 Encounter for gynecological examination (general) (routine) without abnormal findings: Secondary | ICD-10-CM | POA: Diagnosis not present

## 2020-05-18 DIAGNOSIS — I10 Essential (primary) hypertension: Secondary | ICD-10-CM | POA: Diagnosis not present

## 2020-05-18 DIAGNOSIS — E1165 Type 2 diabetes mellitus with hyperglycemia: Secondary | ICD-10-CM | POA: Diagnosis not present

## 2020-05-18 DIAGNOSIS — E7800 Pure hypercholesterolemia, unspecified: Secondary | ICD-10-CM | POA: Diagnosis not present

## 2020-05-18 DIAGNOSIS — E139 Other specified diabetes mellitus without complications: Secondary | ICD-10-CM | POA: Diagnosis not present

## 2020-07-25 DIAGNOSIS — E1165 Type 2 diabetes mellitus with hyperglycemia: Secondary | ICD-10-CM | POA: Diagnosis not present

## 2020-07-25 DIAGNOSIS — I10 Essential (primary) hypertension: Secondary | ICD-10-CM | POA: Diagnosis not present

## 2020-07-25 DIAGNOSIS — E7800 Pure hypercholesterolemia, unspecified: Secondary | ICD-10-CM | POA: Diagnosis not present

## 2020-07-27 DIAGNOSIS — R300 Dysuria: Secondary | ICD-10-CM | POA: Diagnosis not present

## 2020-07-27 DIAGNOSIS — E7800 Pure hypercholesterolemia, unspecified: Secondary | ICD-10-CM | POA: Diagnosis not present

## 2020-07-27 DIAGNOSIS — E1165 Type 2 diabetes mellitus with hyperglycemia: Secondary | ICD-10-CM | POA: Diagnosis not present

## 2020-07-27 DIAGNOSIS — I10 Essential (primary) hypertension: Secondary | ICD-10-CM | POA: Diagnosis not present

## 2020-07-27 DIAGNOSIS — Z794 Long term (current) use of insulin: Secondary | ICD-10-CM | POA: Diagnosis not present

## 2020-09-20 DIAGNOSIS — Z794 Long term (current) use of insulin: Secondary | ICD-10-CM | POA: Diagnosis not present

## 2020-09-20 DIAGNOSIS — E1165 Type 2 diabetes mellitus with hyperglycemia: Secondary | ICD-10-CM | POA: Diagnosis not present

## 2020-09-20 DIAGNOSIS — E7800 Pure hypercholesterolemia, unspecified: Secondary | ICD-10-CM | POA: Diagnosis not present

## 2020-09-20 DIAGNOSIS — Z23 Encounter for immunization: Secondary | ICD-10-CM | POA: Diagnosis not present

## 2020-09-20 DIAGNOSIS — I10 Essential (primary) hypertension: Secondary | ICD-10-CM | POA: Diagnosis not present

## 2020-10-25 DIAGNOSIS — E1165 Type 2 diabetes mellitus with hyperglycemia: Secondary | ICD-10-CM | POA: Diagnosis not present

## 2020-10-25 DIAGNOSIS — I10 Essential (primary) hypertension: Secondary | ICD-10-CM | POA: Diagnosis not present

## 2020-10-25 DIAGNOSIS — E7800 Pure hypercholesterolemia, unspecified: Secondary | ICD-10-CM | POA: Diagnosis not present

## 2020-11-07 DIAGNOSIS — Z0000 Encounter for general adult medical examination without abnormal findings: Secondary | ICD-10-CM | POA: Diagnosis not present

## 2020-11-07 DIAGNOSIS — I10 Essential (primary) hypertension: Secondary | ICD-10-CM | POA: Diagnosis not present

## 2020-11-07 DIAGNOSIS — E1165 Type 2 diabetes mellitus with hyperglycemia: Secondary | ICD-10-CM | POA: Diagnosis not present

## 2020-11-07 DIAGNOSIS — E7800 Pure hypercholesterolemia, unspecified: Secondary | ICD-10-CM | POA: Diagnosis not present

## 2020-11-09 ENCOUNTER — Other Ambulatory Visit: Payer: Self-pay | Admitting: Internal Medicine

## 2020-11-09 DIAGNOSIS — E7800 Pure hypercholesterolemia, unspecified: Secondary | ICD-10-CM

## 2020-11-16 ENCOUNTER — Other Ambulatory Visit: Payer: Self-pay | Admitting: Obstetrics & Gynecology

## 2020-11-16 DIAGNOSIS — N644 Mastodynia: Secondary | ICD-10-CM

## 2020-11-21 DIAGNOSIS — I10 Essential (primary) hypertension: Secondary | ICD-10-CM | POA: Diagnosis not present

## 2020-11-21 DIAGNOSIS — Z794 Long term (current) use of insulin: Secondary | ICD-10-CM | POA: Diagnosis not present

## 2020-11-21 DIAGNOSIS — E7800 Pure hypercholesterolemia, unspecified: Secondary | ICD-10-CM | POA: Diagnosis not present

## 2020-11-21 DIAGNOSIS — E1165 Type 2 diabetes mellitus with hyperglycemia: Secondary | ICD-10-CM | POA: Diagnosis not present

## 2020-11-30 ENCOUNTER — Ambulatory Visit
Admission: RE | Admit: 2020-11-30 | Discharge: 2020-11-30 | Disposition: A | Discharge status: Home / Self Care | Payer: Medicare PPO | Source: Ambulatory Visit | Attending: Internal Medicine | Admitting: Internal Medicine

## 2020-11-30 DIAGNOSIS — E7800 Pure hypercholesterolemia, unspecified: Secondary | ICD-10-CM

## 2020-12-14 DIAGNOSIS — Z794 Long term (current) use of insulin: Secondary | ICD-10-CM | POA: Diagnosis not present

## 2020-12-14 DIAGNOSIS — E1165 Type 2 diabetes mellitus with hyperglycemia: Secondary | ICD-10-CM | POA: Diagnosis not present

## 2020-12-14 DIAGNOSIS — E7800 Pure hypercholesterolemia, unspecified: Secondary | ICD-10-CM | POA: Diagnosis not present

## 2020-12-14 DIAGNOSIS — I10 Essential (primary) hypertension: Secondary | ICD-10-CM | POA: Diagnosis not present

## 2020-12-25 NOTE — Progress Notes (Signed)
Date:  12/26/2020   ID:  Governor Specking, DOB Dec 11, 1955, MRN [id number]  PCP:  Deland Pretty, MD  Cardiologist:  Rex Kras, DO, [facility] (established care 12/26/2020)  REASON FOR CONSULT: Coronary artery disease   REQUESTING PHYSICIAN:  Deland Pretty, MD [address] [place],  [place] [address]  Chief Complaint  Patient presents with   Coronary Artery Disease   Hypertension   New Patient (Initial Visit)    HPI  Amy Luna is a 65 y.o. female who presents to the office with a chief complaint of " coronary artery calcification." Patient's past medical history and cardiovascular risk factors include: hypertension, Type 2 IDDM, HLD, coronary calcification, postmenopausal female, advanced age.  She is referred to the office at the request of Deland Pretty, MD for evaluation of coronary artery disease .  Patient states that she recently went for a well visit at her PCPs office and they were discussing cardiovascular risk factors and given her strong family history they decided to proceed with a coronary calcium score given her risk factors and her underlying insulin-dependent diabetes.  Given the results of the coronary calcium score she is referred to cardiology for further evaluation and management.  She denies any chest pain or anginal discomfort. She is overall euvolemic and not in congestive heart failure.  FUNCTIONAL STATUS: 4 days of the week she participates in line dance and 1 day a week does Zumba each activities approximately 60 minutes.  ALLERGIES: No Known Allergies  MEDICATION LIST PRIOR TO VISIT: Current Meds  Medication Sig   amLODipine-benazepril (LOTREL) 10-20 MG capsule Take 1 capsule by mouth daily.   Ascorbic Acid (VITAMIN C) 1000 MG tablet 1 tablet   aspirin EC 81 MG tablet Take 1 tablet (81 mg total) by mouth daily. Swallow whole.   B-D ULTRAFINE III SHORT PEN 31G X 8 MM MISC    dapagliflozin propanediol (FARXIGA) 10 MG TABS tablet Take 1  tablet by mouth daily at 6 (six) AM.   hydrochlorothiazide (MICROZIDE) 12.5 MG capsule Take 1 capsule by mouth every morning.   insulin aspart (NOVOLOG) 100 UNIT/ML injection For pump   Insulin Disposable Pump (OMNIPOD 5 PACK) MISC    Multiple Vitamin (MULTIVITAMIN) tablet Take 1 tablet by mouth daily.   Omega 3 1200 MG CAPS 2 capsules   ONE TOUCH ULTRA TEST test strip    rosuvastatin (CRESTOR) 5 MG tablet Take 1 tablet by mouth daily.   Sennosides (SENOKOT PO) Take by mouth daily.   vitamin E 100 UNIT capsule Take by mouth daily.     PAST MEDICAL HISTORY: Past Medical History:  Diagnosis Date   Diabetes mellitus ([place])    Diabetic retinopathy ([place])    GERD (gastroesophageal reflux disease)    History of shingles    Hyperlipidemia    Hypertension    Seizure ([place])    Toxic goiter     PAST SURGICAL HISTORY: Past Surgical History:  Procedure Laterality Date   BUNIONECTOMY     COLONOSCOPY  2008   internal hemorrhoids; otherwise normal.  Dr Lajoyce Corners   no prior surgery      FAMILY HISTORY: The patient family history includes Breast cancer in her mother and sister; Diabetes in her father and mother; Heart attack in her father; Hypertension in her mother; Skin cancer in her brother; Stroke in her father.  SOCIAL HISTORY:  The patient  reports that she quit smoking about 41 years ago. Her smoking use included cigarettes. She has a 3.50  pack-year smoking history. She has never used smokeless tobacco. She reports previous alcohol use. She reports that she does not use drugs.  REVIEW OF SYSTEMS: Review of Systems  Constitutional: Negative for chills and fever.  HENT:  Negative for hoarse voice and nosebleeds.   Eyes:  Negative for discharge, double vision and pain.  Cardiovascular:  Negative for chest pain, claudication, dyspnea on exertion, leg swelling, near-syncope, orthopnea, palpitations, paroxysmal nocturnal dyspnea and syncope.  Respiratory:  Negative for hemoptysis and shortness  of breath.   Musculoskeletal:  Negative for muscle cramps and myalgias.  Gastrointestinal:  Negative for abdominal pain, constipation, diarrhea, hematemesis, hematochezia, melena, nausea and vomiting.  Neurological:  Negative for dizziness and light-headedness.   PHYSICAL EXAM: Vitals with BMI 12/26/2020 12/26/2017 12/21/2017  Height 5' 6"  5' 5.5" 5' 6"   Weight 176 lbs 190 lbs 6 oz 193 lbs  BMI 28.42 76.16 07.37  Systolic 106 269 485  Diastolic 71 82 74  Pulse 60 65 77    CONSTITUTIONAL: Well-developed and well-nourished. No acute distress.  SKIN: Skin is warm and dry. No rash noted. No cyanosis. No pallor. No jaundice HEAD: Normocephalic and atraumatic.  EYES: No scleral icterus MOUTH/THROAT: Moist oral membranes.  NECK: No JVD present. No thyromegaly noted. No carotid bruits  LYMPHATIC: No visible cervical adenopathy.  CHEST Normal respiratory effort. No intercostal retractions  LUNGS: Clear to auscultation bilaterally. No stridor. No wheezes. No rales.  CARDIOVASCULAR: Regular rate and rhythm, positive S1-S2, no murmurs rubs or gallops appreciated. ABDOMINAL: soft, nontender, nondistended, positive bowel sounds all 4 quadrants. No apparent ascites.  EXTREMITIES: No peripheral edema  HEMATOLOGIC: No significant bruising NEUROLOGIC: Oriented to person, place, and time. Nonfocal. Normal muscle tone.  PSYCHIATRIC: Normal mood and affect. Normal behavior. Cooperative  CARDIAC DATABASE: EKG: 12/26/2020: Sinus bradycardia, 53 bpm, normal axis, without underlying ischemia or injury pattern.  Echocardiogram: No results found for this or any previous visit from the past 1095 days.   Stress Testing: No results found for this or any previous visit from the past 1095 days.  Heart Catheterization: None  Coronary artery calcium scoring: 12/01/2018: Aortic atherosclerosis. Coronary artery calcium score of 268. This places the patient in the 92nd percentile for subjects of the same age,  gender and race/ethnicity  LABORATORY DATA: CBC Latest Ref Rng & Units 12/20/2017 12/24/2016 12/23/2016  WBC 4.0 - 10.5 K/uL 6.2 11.7(H) 11.5(H)  Hemoglobin 12.0 - 15.0 g/dL 12.6 10.5(L) 11.0(L)  Hematocrit 36.0 - 46.0 % 38.6 31.5(L) 32.4(L)  Platelets 150 - 400 K/uL 229 185 184    CMP Latest Ref Rng & Units 12/20/2017 12/24/2016 12/23/2016  Glucose 70 - 99 mg/dL 283(H) 253(H) 230(H)  BUN 8 - 23 mg/dL 16 15 18   Creatinine 0.44 - 1.00 mg/dL 0.91 0.81 0.80  Sodium 135 - 145 mmol/L 134(L) 138 140  Potassium 3.5 - 5.1 mmol/L 4.0 3.6 4.4  Chloride 98 - 111 mmol/L 99 108 108  CO2 22 - 32 mmol/L 23 22 23   Calcium 8.9 - 10.3 mg/dL 9.6 8.5(L) 8.8(L)  Total Protein 6.5 - 8.1 g/dL 7.8 - -  Total Bilirubin 0.3 - 1.2 mg/dL 1.4(H) - -  Alkaline Phos 38 - 126 U/L 55 - -  AST 15 - 41 U/L 19 - -  ALT 0 - 44 U/L 11 - -    Lipid Panel  No results found for: CHOL, TRIG, HDL, CHOLHDL, VLDL, LDLCALC, LDLDIRECT, LABVLDL  No components found for: NTPROBNP No results for input(s): PROBNP in the  last 8760 hours. No results for input(s): TSH in the last 8760 hours.  BMP No results for input(s): NA, K, CL, CO2, GLUCOSE, BUN, CREATININE, CALCIUM, GFRNONAA, GFRAA in the last 8760 hours.  HEMOGLOBIN A1C No results found for: HGBA1C, MPG  External Labs:  Date Collected: 10/26/2020 , information obtained by PCP Potassium: 4.4 Creatinine 1.09 mg/dL. eGFR: 61 mL/min per 1.73 m Lipid profile: Total cholesterol 176 , triglycerides 53 , HDL 90 , LDL 75 AST: 18 , ALT: 18 , alkaline phosphatase: 60  Hemoglobin A1c: 7.7   IMPRESSION:    ICD-10-CM   1. Coronary atherosclerosis due to calcified coronary lesion of native artery  I25.10 EKG 12-Lead   I25.84 PCV ECHOCARDIOGRAM COMPLETE    PCV MYOCARDIAL PERFUSION WO LEXISCAN    aspirin EC 81 MG tablet    2. Benign hypertension  I10     3. Former smoker, stopped smoking many years ago  Z87.891     10. Type 2 diabetes mellitus with hyperglycemia, with  long-term current use of insulin (HCC)  E11.65 PCV ECHOCARDIOGRAM COMPLETE   Z79.4 PCV MYOCARDIAL PERFUSION WO LEXISCAN    5. Long-term insulin use (HCC)  Z79.4     6. Mixed hyperlipidemia  E78.2        RECOMMENDATIONS: Consandra Laske is a 65 y.o. female whose past medical history and cardiac risk factors include: hypertension, Type 2 IDDM, HLD, coronary calcification, postmenopausal female, advanced age.  Coronary artery calcification: Total coronary calcium score 268 AU, moderate coronary calcification. Given the degree of coronary calcification, 10-year estimated risk of ASCVD, insulin-dependent diabetes, the shared decision was to proceed with echocardiogram and stress test to evaluate for structural heart disease and reversible ischemia respectively. EKG is interpretable and patient can exercise and therefore exercise nuclear stress test was recommended. Continue statin therapy. Will start aspirin 81 mg p.o. daily.  Insulin-dependent diabetes mellitus type 2: Most recent hemoglobin A1c from June 2022 reviewed. Currently managed by primary care provider. Patient understands importance of glycemic control.  Mixed hyperlipidemia: In the setting of diabetes recommended goal LDL less than 70 mg/dL.  She currently is not too far away from her goal and therefore recommended lifestyle changes to decrease her dietary intake of high cholesterol foods. Does not endorse myalgias and joint pain.  Other recent coronary calcium score report she is noted to have an incidental pulmonary nodule.  Patient is aware of this finding and I have asked her to follow-up with her PCP to see if additional work-up is warranted.  She verbalizes understanding.  FINAL MEDICATION LIST END OF ENCOUNTER: Meds ordered this encounter  Medications   aspirin EC 81 MG tablet    Sig: Take 1 tablet (81 mg total) by mouth daily. Swallow whole.    Dispense:  30 tablet    Refill:  11     Medications Discontinued  During This Encounter  Medication Reason   Naproxen Sodium (ALEVE PO) Error   Polyethyl Glycol-Propyl Glycol (SYSTANE ULTRA OP) Error   levETIRAcetam (KEPPRA) 500 MG tablet Error   Insulin Lispro (HUMALOG [place]) Error     Current Outpatient Medications:    amLODipine-benazepril (LOTREL) 10-20 MG capsule, Take 1 capsule by mouth daily., Disp: , Rfl:    Ascorbic Acid (VITAMIN C) 1000 MG tablet, 1 tablet, Disp: , Rfl:    aspirin EC 81 MG tablet, Take 1 tablet (81 mg total) by mouth daily. Swallow whole., Disp: 30 tablet, Rfl: 11   B-D ULTRAFINE III SHORT PEN 31G X  8 MM MISC, , Disp: , Rfl:    dapagliflozin propanediol (FARXIGA) 10 MG TABS tablet, Take 1 tablet by mouth daily at 6 (six) AM., Disp: , Rfl:    hydrochlorothiazide (MICROZIDE) 12.5 MG capsule, Take 1 capsule by mouth every morning., Disp: , Rfl:    insulin aspart (NOVOLOG) 100 UNIT/ML injection, For pump, Disp: , Rfl:    Insulin Disposable Pump (OMNIPOD 5 PACK) MISC, , Disp: , Rfl:    Multiple Vitamin (MULTIVITAMIN) tablet, Take 1 tablet by mouth daily., Disp: , Rfl:    Omega 3 1200 MG CAPS, 2 capsules, Disp: , Rfl:    ONE TOUCH ULTRA TEST test strip, , Disp: , Rfl:    rosuvastatin (CRESTOR) 5 MG tablet, Take 1 tablet by mouth daily., Disp: , Rfl:    Sennosides (SENOKOT PO), Take by mouth daily., Disp: , Rfl:    vitamin E 100 UNIT capsule, Take by mouth daily., Disp: , Rfl:   Orders Placed This Encounter  Procedures   PCV MYOCARDIAL PERFUSION WO LEXISCAN   EKG 12-Lead   PCV ECHOCARDIOGRAM COMPLETE    There are no Patient Instructions on file for this visit.   --Continue cardiac medications as reconciled in final medication list. --Return in about 6 weeks (around 02/06/2021) for Follow up, Coronary artery calcification, Review test results. Or sooner if needed. --Continue follow-up with your primary care physician regarding the management of your other chronic comorbid conditions.  Patient's questions and concerns were  addressed to her satisfaction. She voices understanding of the instructions provided during this encounter.   This note was created using a voice recognition software as a result there may be grammatical errors inadvertently enclosed that do not reflect the nature of this encounter. Every attempt is made to correct such errors.  Rex Kras, [place], [facility]  Pager: [phone] Office: [phone]

## 2020-12-26 ENCOUNTER — Encounter: Payer: Self-pay | Admitting: Cardiology

## 2020-12-26 ENCOUNTER — Ambulatory Visit: Payer: Medicare PPO | Admitting: Cardiology

## 2020-12-26 ENCOUNTER — Other Ambulatory Visit: Payer: Self-pay

## 2020-12-26 VITALS — BP 142/71 | HR 60 | Temp 98.0°F | Resp 16 | Ht 66.0 in | Wt 176.0 lb

## 2020-12-26 DIAGNOSIS — I2510 Atherosclerotic heart disease of native coronary artery without angina pectoris: Secondary | ICD-10-CM

## 2020-12-26 DIAGNOSIS — I2584 Coronary atherosclerosis due to calcified coronary lesion: Secondary | ICD-10-CM

## 2020-12-26 DIAGNOSIS — Z87891 Personal history of nicotine dependence: Secondary | ICD-10-CM

## 2020-12-26 DIAGNOSIS — E782 Mixed hyperlipidemia: Secondary | ICD-10-CM

## 2020-12-26 DIAGNOSIS — Z794 Long term (current) use of insulin: Secondary | ICD-10-CM

## 2020-12-26 DIAGNOSIS — E1165 Type 2 diabetes mellitus with hyperglycemia: Secondary | ICD-10-CM | POA: Diagnosis not present

## 2020-12-26 DIAGNOSIS — I10 Essential (primary) hypertension: Secondary | ICD-10-CM

## 2020-12-26 MED ORDER — ASPIRIN EC 81 MG PO TBEC: 81.0000 mg | DELAYED_RELEASE_TABLET | Freq: Every day | ORAL | 11 refills | Status: DC

## 2020-12-28 ENCOUNTER — Ambulatory Visit
Admission: RE | Admit: 2020-12-28 | Discharge: 2020-12-28 | Disposition: A | Discharge status: Home / Self Care | Payer: Medicare PPO | Source: Ambulatory Visit | Attending: Obstetrics & Gynecology | Admitting: Obstetrics & Gynecology

## 2020-12-28 ENCOUNTER — Other Ambulatory Visit: Payer: Self-pay

## 2020-12-28 DIAGNOSIS — R922 Inconclusive mammogram: Secondary | ICD-10-CM | POA: Diagnosis not present

## 2020-12-28 DIAGNOSIS — N644 Mastodynia: Secondary | ICD-10-CM

## 2021-01-01 ENCOUNTER — Other Ambulatory Visit: Payer: Self-pay

## 2021-01-01 ENCOUNTER — Ambulatory Visit: Payer: Medicare PPO

## 2021-01-01 DIAGNOSIS — E1165 Type 2 diabetes mellitus with hyperglycemia: Secondary | ICD-10-CM

## 2021-01-01 DIAGNOSIS — I2584 Coronary atherosclerosis due to calcified coronary lesion: Secondary | ICD-10-CM | POA: Diagnosis not present

## 2021-01-01 DIAGNOSIS — I2510 Atherosclerotic heart disease of native coronary artery without angina pectoris: Secondary | ICD-10-CM | POA: Diagnosis not present

## 2021-01-01 DIAGNOSIS — Z794 Long term (current) use of insulin: Secondary | ICD-10-CM | POA: Diagnosis not present

## 2021-01-03 ENCOUNTER — Other Ambulatory Visit: Payer: Medicare PPO

## 2021-01-04 ENCOUNTER — Other Ambulatory Visit: Payer: Medicare PPO

## 2021-01-05 NOTE — Progress Notes (Signed)
Called and spoke with patient regarding her echocardiogram results.

## 2021-01-10 DIAGNOSIS — E139 Other specified diabetes mellitus without complications: Secondary | ICD-10-CM | POA: Diagnosis not present

## 2021-01-15 ENCOUNTER — Ambulatory Visit: Payer: Medicare PPO

## 2021-01-15 ENCOUNTER — Other Ambulatory Visit: Payer: Self-pay

## 2021-01-15 DIAGNOSIS — I2584 Coronary atherosclerosis due to calcified coronary lesion: Secondary | ICD-10-CM | POA: Diagnosis not present

## 2021-01-15 DIAGNOSIS — I2510 Atherosclerotic heart disease of native coronary artery without angina pectoris: Secondary | ICD-10-CM | POA: Diagnosis not present

## 2021-01-15 DIAGNOSIS — E1165 Type 2 diabetes mellitus with hyperglycemia: Secondary | ICD-10-CM | POA: Diagnosis not present

## 2021-01-15 DIAGNOSIS — Z794 Long term (current) use of insulin: Secondary | ICD-10-CM | POA: Diagnosis not present

## 2021-01-18 DIAGNOSIS — Z794 Long term (current) use of insulin: Secondary | ICD-10-CM | POA: Diagnosis not present

## 2021-01-18 DIAGNOSIS — E7800 Pure hypercholesterolemia, unspecified: Secondary | ICD-10-CM | POA: Diagnosis not present

## 2021-01-18 DIAGNOSIS — I10 Essential (primary) hypertension: Secondary | ICD-10-CM | POA: Diagnosis not present

## 2021-01-18 DIAGNOSIS — E1165 Type 2 diabetes mellitus with hyperglycemia: Secondary | ICD-10-CM | POA: Diagnosis not present

## 2021-01-24 NOTE — Progress Notes (Signed)
Called and spoke with patient regarding her stress test results. Patient stated that she has been feeling fine.

## 2021-02-06 ENCOUNTER — Ambulatory Visit: Payer: Medicare PPO | Admitting: Cardiology

## 2021-02-10 DIAGNOSIS — E139 Other specified diabetes mellitus without complications: Secondary | ICD-10-CM | POA: Diagnosis not present

## 2021-03-12 DIAGNOSIS — E139 Other specified diabetes mellitus without complications: Secondary | ICD-10-CM | POA: Diagnosis not present

## 2021-04-12 DIAGNOSIS — E139 Other specified diabetes mellitus without complications: Secondary | ICD-10-CM | POA: Diagnosis not present

## 2021-04-25 DIAGNOSIS — E7800 Pure hypercholesterolemia, unspecified: Secondary | ICD-10-CM | POA: Diagnosis not present

## 2021-04-25 DIAGNOSIS — Z794 Long term (current) use of insulin: Secondary | ICD-10-CM | POA: Diagnosis not present

## 2021-04-25 DIAGNOSIS — I10 Essential (primary) hypertension: Secondary | ICD-10-CM | POA: Diagnosis not present

## 2021-04-25 DIAGNOSIS — E1165 Type 2 diabetes mellitus with hyperglycemia: Secondary | ICD-10-CM | POA: Diagnosis not present

## 2021-04-27 DIAGNOSIS — H2513 Age-related nuclear cataract, bilateral: Secondary | ICD-10-CM | POA: Diagnosis not present

## 2021-04-27 DIAGNOSIS — E113293 Type 2 diabetes mellitus with mild nonproliferative diabetic retinopathy without macular edema, bilateral: Secondary | ICD-10-CM | POA: Diagnosis not present

## 2021-04-27 DIAGNOSIS — Z794 Long term (current) use of insulin: Secondary | ICD-10-CM | POA: Diagnosis not present

## 2021-04-27 DIAGNOSIS — H25013 Cortical age-related cataract, bilateral: Secondary | ICD-10-CM | POA: Diagnosis not present

## 2021-05-08 ENCOUNTER — Other Ambulatory Visit: Payer: Self-pay | Admitting: Obstetrics & Gynecology

## 2021-05-08 DIAGNOSIS — Z1231 Encounter for screening mammogram for malignant neoplasm of breast: Secondary | ICD-10-CM

## 2021-05-12 DIAGNOSIS — E139 Other specified diabetes mellitus without complications: Secondary | ICD-10-CM | POA: Diagnosis not present

## 2021-05-28 DIAGNOSIS — H25813 Combined forms of age-related cataract, bilateral: Secondary | ICD-10-CM | POA: Diagnosis not present

## 2021-05-28 DIAGNOSIS — H25811 Combined forms of age-related cataract, right eye: Secondary | ICD-10-CM | POA: Diagnosis not present

## 2021-06-06 ENCOUNTER — Ambulatory Visit
Admission: RE | Admit: 2021-06-06 | Discharge: 2021-06-06 | Disposition: A | Discharge status: Home / Self Care | Payer: Medicare PPO | Source: Ambulatory Visit | Attending: Obstetrics & Gynecology | Admitting: Obstetrics & Gynecology

## 2021-06-06 DIAGNOSIS — Z1231 Encounter for screening mammogram for malignant neoplasm of breast: Secondary | ICD-10-CM

## 2021-06-12 DIAGNOSIS — E139 Other specified diabetes mellitus without complications: Secondary | ICD-10-CM | POA: Diagnosis not present

## 2021-07-11 DIAGNOSIS — H268 Other specified cataract: Secondary | ICD-10-CM | POA: Diagnosis not present

## 2021-07-11 DIAGNOSIS — H25812 Combined forms of age-related cataract, left eye: Secondary | ICD-10-CM | POA: Diagnosis not present

## 2021-07-11 DIAGNOSIS — H5703 Miosis: Secondary | ICD-10-CM | POA: Diagnosis not present

## 2021-07-13 DIAGNOSIS — E139 Other specified diabetes mellitus without complications: Secondary | ICD-10-CM | POA: Diagnosis not present

## 2021-07-24 DIAGNOSIS — E1165 Type 2 diabetes mellitus with hyperglycemia: Secondary | ICD-10-CM | POA: Diagnosis not present

## 2021-07-24 DIAGNOSIS — E7800 Pure hypercholesterolemia, unspecified: Secondary | ICD-10-CM | POA: Diagnosis not present

## 2021-07-24 DIAGNOSIS — I10 Essential (primary) hypertension: Secondary | ICD-10-CM | POA: Diagnosis not present

## 2021-07-24 DIAGNOSIS — Z794 Long term (current) use of insulin: Secondary | ICD-10-CM | POA: Diagnosis not present

## 2021-07-30 DIAGNOSIS — Z6827 Body mass index (BMI) 27.0-27.9, adult: Secondary | ICD-10-CM | POA: Diagnosis not present

## 2021-07-30 DIAGNOSIS — Z01419 Encounter for gynecological examination (general) (routine) without abnormal findings: Secondary | ICD-10-CM | POA: Diagnosis not present

## 2021-08-10 DIAGNOSIS — E139 Other specified diabetes mellitus without complications: Secondary | ICD-10-CM | POA: Diagnosis not present

## 2021-08-13 DIAGNOSIS — E139 Other specified diabetes mellitus without complications: Secondary | ICD-10-CM | POA: Diagnosis not present

## 2021-09-19 DIAGNOSIS — E139 Other specified diabetes mellitus without complications: Secondary | ICD-10-CM | POA: Diagnosis not present

## 2021-09-26 DIAGNOSIS — E7800 Pure hypercholesterolemia, unspecified: Secondary | ICD-10-CM | POA: Diagnosis not present

## 2021-09-26 DIAGNOSIS — E1165 Type 2 diabetes mellitus with hyperglycemia: Secondary | ICD-10-CM | POA: Diagnosis not present

## 2021-09-26 DIAGNOSIS — I10 Essential (primary) hypertension: Secondary | ICD-10-CM | POA: Diagnosis not present

## 2021-09-26 DIAGNOSIS — Z794 Long term (current) use of insulin: Secondary | ICD-10-CM | POA: Diagnosis not present

## 2021-10-11 DIAGNOSIS — I10 Essential (primary) hypertension: Secondary | ICD-10-CM | POA: Diagnosis not present

## 2021-10-11 DIAGNOSIS — E7800 Pure hypercholesterolemia, unspecified: Secondary | ICD-10-CM | POA: Diagnosis not present

## 2021-10-11 DIAGNOSIS — E1165 Type 2 diabetes mellitus with hyperglycemia: Secondary | ICD-10-CM | POA: Diagnosis not present

## 2021-10-11 DIAGNOSIS — Z794 Long term (current) use of insulin: Secondary | ICD-10-CM | POA: Diagnosis not present

## 2021-10-19 DIAGNOSIS — E139 Other specified diabetes mellitus without complications: Secondary | ICD-10-CM | POA: Diagnosis not present

## 2021-10-24 DIAGNOSIS — E139 Other specified diabetes mellitus without complications: Secondary | ICD-10-CM | POA: Diagnosis not present

## 2021-10-24 DIAGNOSIS — E1165 Type 2 diabetes mellitus with hyperglycemia: Secondary | ICD-10-CM | POA: Diagnosis not present

## 2021-11-08 DIAGNOSIS — E1165 Type 2 diabetes mellitus with hyperglycemia: Secondary | ICD-10-CM | POA: Diagnosis not present

## 2021-11-08 DIAGNOSIS — E7801 Familial hypercholesterolemia: Secondary | ICD-10-CM | POA: Diagnosis not present

## 2021-11-08 DIAGNOSIS — E559 Vitamin D deficiency, unspecified: Secondary | ICD-10-CM | POA: Diagnosis not present

## 2021-11-14 DIAGNOSIS — Z0000 Encounter for general adult medical examination without abnormal findings: Secondary | ICD-10-CM | POA: Diagnosis not present

## 2021-11-14 DIAGNOSIS — E7800 Pure hypercholesterolemia, unspecified: Secondary | ICD-10-CM | POA: Diagnosis not present

## 2021-11-14 DIAGNOSIS — R911 Solitary pulmonary nodule: Secondary | ICD-10-CM | POA: Diagnosis not present

## 2021-11-14 DIAGNOSIS — E1165 Type 2 diabetes mellitus with hyperglycemia: Secondary | ICD-10-CM | POA: Diagnosis not present

## 2021-11-14 DIAGNOSIS — E559 Vitamin D deficiency, unspecified: Secondary | ICD-10-CM | POA: Diagnosis not present

## 2021-11-19 DIAGNOSIS — E139 Other specified diabetes mellitus without complications: Secondary | ICD-10-CM | POA: Diagnosis not present

## 2021-11-19 DIAGNOSIS — E11628 Type 2 diabetes mellitus with other skin complications: Secondary | ICD-10-CM | POA: Diagnosis not present

## 2021-12-03 DIAGNOSIS — E11628 Type 2 diabetes mellitus with other skin complications: Secondary | ICD-10-CM | POA: Diagnosis not present

## 2021-12-12 ENCOUNTER — Other Ambulatory Visit: Payer: Self-pay | Admitting: Registered Nurse

## 2021-12-12 ENCOUNTER — Other Ambulatory Visit: Payer: Self-pay | Admitting: Internal Medicine

## 2021-12-12 DIAGNOSIS — Z0000 Encounter for general adult medical examination without abnormal findings: Secondary | ICD-10-CM

## 2022-01-03 ENCOUNTER — Ambulatory Visit
Admission: EM | Admit: 2022-01-03 | Discharge: 2022-01-03 | Disposition: A | Discharge status: Home / Self Care | Payer: Medicare PPO | Attending: Physician Assistant | Admitting: Physician Assistant

## 2022-01-03 DIAGNOSIS — N760 Acute vaginitis: Secondary | ICD-10-CM | POA: Insufficient documentation

## 2022-01-03 MED ORDER — PREDNISONE 20 MG PO TABS: 40.0000 mg | ORAL_TABLET | Freq: Every day | ORAL | 0 refills | Status: AC

## 2022-01-03 NOTE — ED Triage Notes (Signed)
Pt presents with vaginal irritation and pain X 3 weeks.

## 2022-01-03 NOTE — ED Provider Notes (Signed)
[facility]    CSN: [id number] Arrival date & time: 01/03/22  1218      History   Chief Complaint Chief Complaint  Patient presents with   Vaginitis    HPI Amy Luna is a 66 y.o. female.   Patient here today for evaluation of vaginal irritation that she has had the last 3 weeks.  She reports that she has significant itching and dryness after she had intercourse 3 weeks ago and states she noted significant vaginal burning when her fianc ejaculated.  She reports since that time she has tried Monistat as well as hydrocortisone cream to her vaginal area but continues to have significant itching.  She notes that her genital area is so irritated she has difficulty wiping and is using a blow dryer to dry herself.  She denies any known rash.  She reports she has minimal discharge at this time. She denies any urinary symptoms.  The history is provided by the patient.    Past Medical History:  Diagnosis Date   Diabetes mellitus (HCC)    Diabetic retinopathy (HCC)    GERD (gastroesophageal reflux disease)    History of shingles    Hyperlipidemia    Hypertension    Seizure (HCC)    Toxic goiter     Patient Active Problem List   Diagnosis Date Noted   DKA (diabetic ketoacidoses) 12/23/2016   Essential hypertension 12/23/2016    Past Surgical History:  Procedure Laterality Date   BUNIONECTOMY     COLONOSCOPY  2008   internal hemorrhoids; otherwise normal.  Dr Virginia Rochester   no prior surgery      OB History   No obstetric history on file.      Home Medications    Prior to Admission medications   Medication Sig Start Date End Date Taking? Authorizing Provider  predniSONE (DELTASONE) 20 MG tablet Take 2 tablets (40 mg total) by mouth daily with breakfast for 5 days. 01/03/22 01/08/22 Yes Tomi Bamberger, PA-C  amLODipine-benazepril (LOTREL) 10-20 MG capsule Take 1 capsule by mouth daily. 11/26/16   [provider]  Ascorbic Acid (VITAMIN C) 1000 MG tablet  1 tablet    [provider]  aspirin EC 81 MG tablet Take 1 tablet (81 mg total) by mouth daily. Swallow whole. 12/26/20   Tolia, Sunit, DO  B-D ULTRAFINE III SHORT PEN 31G X 8 MM MISC  11/08/11   [provider]  dapagliflozin propanediol (FARXIGA) 10 MG TABS tablet Take 1 tablet by mouth daily at 6 (six) AM.    [provider]  hydrochlorothiazide (MICROZIDE) 12.5 MG capsule Take 1 capsule by mouth every morning.    [provider]  insulin aspart (NOVOLOG) 100 UNIT/ML injection For pump    [provider]  Insulin Disposable Pump (OMNIPOD 5 PACK) MISC  11/07/16   [provider]  Multiple Vitamin (MULTIVITAMIN) tablet Take 1 tablet by mouth daily.    [provider]  Omega 3 1200 MG CAPS 2 capsules    [provider]  ONE TOUCH ULTRA TEST test strip  11/07/11   [provider]  rosuvastatin (CRESTOR) 5 MG tablet Take 1 tablet by mouth daily.    [provider]  Sennosides (SENOKOT PO) Take by mouth daily.    [provider]  vitamin E 100 UNIT capsule Take by mouth daily.    [provider]    Family History Family History  Problem Relation Age of Onset  Breast cancer Mother 1   Diabetes Mother    Hypertension Mother    Heart attack Father    Diabetes Father    Stroke Father    Breast cancer Sister    Breast cancer Sister    Breast cancer Sister    Breast cancer Sister    Breast cancer Sister    Skin cancer Brother    Colon cancer Neg Hx    Stomach cancer Neg Hx     Social History Social History   Tobacco Use   Smoking status: Former    Packs/day: 0.50    Years: 7.00    Total pack years: 3.50    Types: Cigarettes    Quit date: 01/31/1979    Years since quitting: 42.9   Smokeless tobacco: Never  Vaping Use   Vaping Use: Never used  Substance Use Topics   Alcohol use: Not Currently    Comment: rare   Drug use: No     Allergies   Patient has no known  allergies.   Review of Systems Review of Systems  Constitutional:  Negative for chills and fever.  Respiratory:  Negative for shortness of breath.   Cardiovascular:  Negative for chest pain.  Gastrointestinal:  Negative for abdominal pain, nausea and vomiting.  Genitourinary:  Positive for vaginal discharge. Negative for dysuria and frequency.  Musculoskeletal:  Negative for back pain.     Physical Exam Triage Vital Signs ED Triage Vitals  Enc Vitals Group     BP      Pulse      Resp      Temp      Temp src      SpO2      Weight      Height      Head Circumference      Peak Flow      Pain Score      Pain Loc      Pain Edu?      Excl. in GC?    No data found.  Updated Vital Signs BP (!) 148/73 (BP Location: Left Arm)   Pulse 68   Temp 98 F (36.7 C) (Oral)   Resp 17   SpO2 98%      Physical Exam Vitals and nursing note reviewed.  Constitutional:      General: She is not in acute distress.    Appearance: Normal appearance. She is not ill-appearing.  HENT:     Head: Normocephalic and atraumatic.     Nose: Nose normal.  Cardiovascular:     Rate and Rhythm: Normal rate.  Pulmonary:     Effort: Pulmonary effort is normal. No respiratory distress.  Skin:    General: Skin is warm and dry.  Neurological:     Mental Status: She is alert.  Psychiatric:        Mood and Affect: Mood normal.        Thought Content: Thought content normal.      UC Treatments / Results  Labs (all labs ordered are listed, but only abnormal results are displayed) Labs Reviewed  CERVICOVAGINAL ANCILLARY ONLY    EKG   Radiology No results found.  Procedures Procedures (including critical care time)  Medications Ordered in UC Medications - No data to display  Initial Impression / Assessment and Plan / UC Course  I have reviewed the triage vital signs and the nursing notes.  Pertinent labs & imaging results that were available during my care of  the patient were  reviewed by me and considered in my medical decision making (see chart for details).    Given significant reported inflammation and irritation will treat with steroid burst orally and screening ordered for yeast, BV as well as gonorrhea chlamydia and trichomonas.  Recommended follow-up if symptoms do not improve or worsen.  Final Clinical Impressions(s) / UC Diagnoses   Final diagnoses:  Vaginitis and vulvovaginitis   Discharge Instructions   None    ED Prescriptions     Medication Sig Dispense Auth. Provider   predniSONE (DELTASONE) 20 MG tablet Take 2 tablets (40 mg total) by mouth daily with breakfast for 5 days. 10 tablet Tomi Bamberger, PA-C      PDMP not reviewed this encounter.   Tomi Bamberger, PA-C 01/03/22 1355

## 2022-01-04 LAB — CERVICOVAGINAL ANCILLARY ONLY
Bacterial Vaginitis (gardnerella): NEGATIVE
Candida Glabrata: NEGATIVE
Candida Vaginitis: POSITIVE — AB
Chlamydia: NEGATIVE
Comment: NEGATIVE
Comment: NEGATIVE
Comment: NEGATIVE
Comment: NEGATIVE
Comment: NEGATIVE
Comment: NORMAL
Neisseria Gonorrhea: NEGATIVE
Trichomonas: NEGATIVE

## 2022-01-07 ENCOUNTER — Telehealth (HOSPITAL_COMMUNITY): Payer: Self-pay | Admitting: Emergency Medicine

## 2022-01-07 DIAGNOSIS — E139 Other specified diabetes mellitus without complications: Secondary | ICD-10-CM | POA: Diagnosis not present

## 2022-01-07 MED ORDER — FLUCONAZOLE 150 MG PO TABS: 150.0000 mg | ORAL_TABLET | Freq: Once | ORAL | 0 refills | Status: AC

## 2022-01-14 ENCOUNTER — Other Ambulatory Visit: Payer: Self-pay | Admitting: Obstetrics & Gynecology

## 2022-01-14 DIAGNOSIS — Z1231 Encounter for screening mammogram for malignant neoplasm of breast: Secondary | ICD-10-CM

## 2022-01-21 ENCOUNTER — Other Ambulatory Visit: Payer: Self-pay

## 2022-01-21 ENCOUNTER — Encounter (HOSPITAL_COMMUNITY): Payer: Self-pay

## 2022-01-21 ENCOUNTER — Emergency Department (HOSPITAL_COMMUNITY)
Admission: EM | Admit: 2022-01-21 | Discharge: 2022-01-22 | Discharge status: Against Medical Advice | Payer: Medicare PPO | Attending: Emergency Medicine | Admitting: Emergency Medicine

## 2022-01-21 DIAGNOSIS — R1110 Vomiting, unspecified: Secondary | ICD-10-CM | POA: Insufficient documentation

## 2022-01-21 DIAGNOSIS — R109 Unspecified abdominal pain: Secondary | ICD-10-CM | POA: Diagnosis not present

## 2022-01-21 DIAGNOSIS — R197 Diarrhea, unspecified: Secondary | ICD-10-CM | POA: Diagnosis not present

## 2022-01-21 DIAGNOSIS — E119 Type 2 diabetes mellitus without complications: Secondary | ICD-10-CM | POA: Insufficient documentation

## 2022-01-21 DIAGNOSIS — Z5321 Procedure and treatment not carried out due to patient leaving prior to being seen by health care provider: Secondary | ICD-10-CM | POA: Diagnosis not present

## 2022-01-21 DIAGNOSIS — R112 Nausea with vomiting, unspecified: Secondary | ICD-10-CM | POA: Diagnosis not present

## 2022-01-21 LAB — URINALYSIS, ROUTINE W REFLEX MICROSCOPIC
Bacteria, UA: NONE SEEN
Bilirubin Urine: NEGATIVE
Glucose, UA: >=500 mg/dL — AB
Ketones, ur: 20 mg/dL — AB
Leukocytes,Ua: NEGATIVE
Nitrite: NEGATIVE
Protein, ur: NEGATIVE mg/dL
Specific Gravity, Urine: 1.022 (ref 1.005–1.030)
pH: 5 (ref 5.0–8.0)

## 2022-01-21 LAB — BLOOD GAS, VENOUS
Acid-Base Excess: 4.8 mmol/L — ABNORMAL HIGH (ref 0.0–2.0)
Bicarbonate: 29.9 mmol/L — ABNORMAL HIGH (ref 20.0–28.0)
O2 Saturation: 40.4 %
Patient temperature: 37
pCO2, Ven: 45 mmHg (ref 44–60)
pH, Ven: 7.43 (ref 7.25–7.43)
pO2, Ven: <31 mmHg — CL (ref 32–45)

## 2022-01-21 LAB — CBC WITH DIFFERENTIAL/PLATELET
Abs Immature Granulocytes: 0.1 10*3/uL — ABNORMAL HIGH (ref 0.00–0.07)
Basophils Absolute: 0 10*3/uL (ref 0.0–0.1)
Basophils Relative: 0 %
Eosinophils Absolute: 0 10*3/uL (ref 0.0–0.5)
Eosinophils Relative: 0 %
HCT: 38.6 % (ref 36.0–46.0)
Hemoglobin: 12.6 g/dL (ref 12.0–15.0)
Immature Granulocytes: 1 %
Lymphocytes Relative: 9 %
Lymphs Abs: 1.9 10*3/uL (ref 0.7–4.0)
MCH: 30 pg (ref 26.0–34.0)
MCHC: 32.6 g/dL (ref 30.0–36.0)
MCV: 91.9 fL (ref 80.0–100.0)
Monocytes Absolute: 0.9 10*3/uL (ref 0.1–1.0)
Monocytes Relative: 4 %
Neutro Abs: 18.6 10*3/uL — ABNORMAL HIGH (ref 1.7–7.7)
Neutrophils Relative %: 86 %
Platelets: 199 10*3/uL (ref 150–400)
RBC: 4.2 MIL/uL (ref 3.87–5.11)
RDW: 13.1 % (ref 11.5–15.5)
WBC: 21.5 10*3/uL — ABNORMAL HIGH (ref 4.0–10.5)
nRBC: 0 % (ref 0.0–0.2)

## 2022-01-21 LAB — COMPREHENSIVE METABOLIC PANEL
ALT: 22 U/L (ref 0–44)
AST: 26 U/L (ref 15–41)
Albumin: 4.8 g/dL (ref 3.5–5.0)
Alkaline Phosphatase: 54 U/L (ref 38–126)
Anion gap: 13 (ref 5–15)
BUN: 35 mg/dL — ABNORMAL HIGH (ref 8–23)
CO2: 24 mmol/L (ref 22–32)
Calcium: 10.2 mg/dL (ref 8.9–10.3)
Chloride: 105 mmol/L (ref 98–111)
Creatinine, Ser: 1.27 mg/dL — ABNORMAL HIGH (ref 0.44–1.00)
GFR, Estimated: 46 mL/min — ABNORMAL LOW (ref >60)
Glucose, Bld: 251 mg/dL — ABNORMAL HIGH (ref 70–99)
Potassium: 4.5 mmol/L (ref 3.5–5.1)
Sodium: 142 mmol/L (ref 135–145)
Total Bilirubin: 1.7 mg/dL — ABNORMAL HIGH (ref 0.3–1.2)
Total Protein: 8.2 g/dL — ABNORMAL HIGH (ref 6.5–8.1)

## 2022-01-21 LAB — CBG MONITORING, ED: Glucose-Capillary: 259 mg/dL — ABNORMAL HIGH (ref 70–99)

## 2022-01-21 LAB — LIPASE, BLOOD: Lipase: 27 U/L (ref 11–51)

## 2022-01-21 NOTE — ED Notes (Addendum)
Venous PO2 <31.0. Per lab, result wont cross over. Call in to IT. Provider made aware Felicita Gage PA).

## 2022-01-21 NOTE — ED Provider Triage Note (Signed)
Emergency Medicine Provider Triage Evaluation Note  Amy Luna , a 66 y.o. female  was evaluated in triage.  Pt complains of nausea, vomiting and diarrhea.  Blood sugars at home have been 500s to "high" on meter.  Patient has a history of insulin-dependent diabetes.  Symptoms started yesterday.  She has been unable to tolerate fluids today.  Only minimal epigastric abdominal pain.  No dysuria or fever.  Reports being on prednisone recently for a "yeast infection", completed course 2 weeks ago.  States that her sugars remain controlled during this time.  Review of Systems  Positive: Nausea, vomiting, diarrhea Negative: Fever  Physical Exam  BP 129/68 (BP Location: Left Arm)   Pulse 100   Temp 99 F (37.2 C) (Oral)   Resp 18   Ht 5\' 6"  (1.676 m)   Wt 75.8 kg   SpO2 98%   BMI 26.95 kg/m  Gen:   Awake, no distress   Resp:  Normal effort  MSK:   Moves extremities without difficulty  Other:  Abdomen soft and nontender  Medical Decision Making  Medically screening exam initiated at 6:45 PM.  Appropriate orders placed.  was informed that the remainder of the evaluation will be completed by another provider, this initial triage assessment does not replace that evaluation, and the importance of remaining in the ED until their evaluation is complete.     Arley Phenix, PA-C 01/21/22 1851

## 2022-01-21 NOTE — ED Notes (Addendum)
Pt stated she already had her vital's taking. Then her and her husband left.

## 2022-01-21 NOTE — ED Triage Notes (Addendum)
Patient reports that she started vomiting last night and having abdominal pain.  Patient also reports a history of DM and states she has had fluctuating blood sugars. CBG in triage-259.

## 2022-01-21 NOTE — ED Notes (Signed)
Need IV 

## 2022-02-12 ENCOUNTER — Inpatient Hospital Stay: Admission: RE | Admit: 2022-02-12 | Payer: Medicare PPO | Source: Ambulatory Visit

## 2022-02-14 ENCOUNTER — Ambulatory Visit
Admission: RE | Admit: 2022-02-14 | Discharge: 2022-02-14 | Disposition: A | Discharge status: Home / Self Care | Payer: Medicare PPO | Source: Ambulatory Visit | Attending: Internal Medicine | Admitting: Internal Medicine

## 2022-02-14 DIAGNOSIS — R911 Solitary pulmonary nodule: Secondary | ICD-10-CM | POA: Diagnosis not present

## 2022-02-14 DIAGNOSIS — Z0000 Encounter for general adult medical examination without abnormal findings: Secondary | ICD-10-CM

## 2022-02-14 DIAGNOSIS — I700 Atherosclerosis of aorta: Secondary | ICD-10-CM | POA: Diagnosis not present

## 2022-02-14 DIAGNOSIS — H40013 Open angle with borderline findings, low risk, bilateral: Secondary | ICD-10-CM | POA: Diagnosis not present

## 2022-02-14 DIAGNOSIS — R918 Other nonspecific abnormal finding of lung field: Secondary | ICD-10-CM | POA: Diagnosis not present

## 2022-02-14 DIAGNOSIS — E113393 Type 2 diabetes mellitus with moderate nonproliferative diabetic retinopathy without macular edema, bilateral: Secondary | ICD-10-CM | POA: Diagnosis not present

## 2022-02-14 DIAGNOSIS — Z794 Long term (current) use of insulin: Secondary | ICD-10-CM | POA: Diagnosis not present

## 2022-02-14 DIAGNOSIS — H04123 Dry eye syndrome of bilateral lacrimal glands: Secondary | ICD-10-CM | POA: Diagnosis not present

## 2022-04-02 DIAGNOSIS — Z794 Long term (current) use of insulin: Secondary | ICD-10-CM | POA: Diagnosis not present

## 2022-04-02 DIAGNOSIS — E7800 Pure hypercholesterolemia, unspecified: Secondary | ICD-10-CM | POA: Diagnosis not present

## 2022-04-02 DIAGNOSIS — I10 Essential (primary) hypertension: Secondary | ICD-10-CM | POA: Diagnosis not present

## 2022-04-02 DIAGNOSIS — E1165 Type 2 diabetes mellitus with hyperglycemia: Secondary | ICD-10-CM | POA: Diagnosis not present

## 2022-05-16 DIAGNOSIS — E7800 Pure hypercholesterolemia, unspecified: Secondary | ICD-10-CM | POA: Diagnosis not present

## 2022-05-16 DIAGNOSIS — E1165 Type 2 diabetes mellitus with hyperglycemia: Secondary | ICD-10-CM | POA: Diagnosis not present

## 2022-05-23 DIAGNOSIS — I2510 Atherosclerotic heart disease of native coronary artery without angina pectoris: Secondary | ICD-10-CM | POA: Diagnosis not present

## 2022-05-23 DIAGNOSIS — E559 Vitamin D deficiency, unspecified: Secondary | ICD-10-CM | POA: Diagnosis not present

## 2022-05-23 DIAGNOSIS — E1165 Type 2 diabetes mellitus with hyperglycemia: Secondary | ICD-10-CM | POA: Diagnosis not present

## 2022-05-23 DIAGNOSIS — E7800 Pure hypercholesterolemia, unspecified: Secondary | ICD-10-CM | POA: Diagnosis not present

## 2022-05-23 DIAGNOSIS — I700 Atherosclerosis of aorta: Secondary | ICD-10-CM | POA: Diagnosis not present

## 2022-05-23 DIAGNOSIS — M545 Low back pain, unspecified: Secondary | ICD-10-CM | POA: Diagnosis not present

## 2022-05-23 DIAGNOSIS — I10 Essential (primary) hypertension: Secondary | ICD-10-CM | POA: Diagnosis not present

## 2022-05-30 DIAGNOSIS — E7800 Pure hypercholesterolemia, unspecified: Secondary | ICD-10-CM | POA: Diagnosis not present

## 2022-05-30 DIAGNOSIS — I10 Essential (primary) hypertension: Secondary | ICD-10-CM | POA: Diagnosis not present

## 2022-05-30 DIAGNOSIS — Z794 Long term (current) use of insulin: Secondary | ICD-10-CM | POA: Diagnosis not present

## 2022-05-30 DIAGNOSIS — E1165 Type 2 diabetes mellitus with hyperglycemia: Secondary | ICD-10-CM | POA: Diagnosis not present

## 2022-06-24 DIAGNOSIS — E139 Other specified diabetes mellitus without complications: Secondary | ICD-10-CM | POA: Diagnosis not present

## 2022-07-08 ENCOUNTER — Ambulatory Visit
Admission: RE | Admit: 2022-07-08 | Discharge: 2022-07-08 | Disposition: A | Discharge status: Home / Self Care | Payer: Medicare PPO | Source: Ambulatory Visit | Attending: Obstetrics & Gynecology | Admitting: Obstetrics & Gynecology

## 2022-07-08 DIAGNOSIS — Z1231 Encounter for screening mammogram for malignant neoplasm of breast: Secondary | ICD-10-CM

## 2022-07-15 DIAGNOSIS — I10 Essential (primary) hypertension: Secondary | ICD-10-CM | POA: Diagnosis not present

## 2022-07-15 DIAGNOSIS — E162 Hypoglycemia, unspecified: Secondary | ICD-10-CM | POA: Diagnosis not present

## 2022-07-15 DIAGNOSIS — R42 Dizziness and giddiness: Secondary | ICD-10-CM | POA: Diagnosis not present

## 2022-07-16 DIAGNOSIS — E7800 Pure hypercholesterolemia, unspecified: Secondary | ICD-10-CM | POA: Diagnosis not present

## 2022-07-16 DIAGNOSIS — E1165 Type 2 diabetes mellitus with hyperglycemia: Secondary | ICD-10-CM | POA: Diagnosis not present

## 2022-07-16 DIAGNOSIS — I10 Essential (primary) hypertension: Secondary | ICD-10-CM | POA: Diagnosis not present

## 2022-07-16 DIAGNOSIS — Z794 Long term (current) use of insulin: Secondary | ICD-10-CM | POA: Diagnosis not present

## 2022-07-25 DIAGNOSIS — E139 Other specified diabetes mellitus without complications: Secondary | ICD-10-CM | POA: Diagnosis not present

## 2022-08-06 DIAGNOSIS — Z794 Long term (current) use of insulin: Secondary | ICD-10-CM | POA: Diagnosis not present

## 2022-08-06 DIAGNOSIS — E7800 Pure hypercholesterolemia, unspecified: Secondary | ICD-10-CM | POA: Diagnosis not present

## 2022-08-06 DIAGNOSIS — I10 Essential (primary) hypertension: Secondary | ICD-10-CM | POA: Diagnosis not present

## 2022-08-06 DIAGNOSIS — E1165 Type 2 diabetes mellitus with hyperglycemia: Secondary | ICD-10-CM | POA: Diagnosis not present

## 2022-08-22 DIAGNOSIS — E113393 Type 2 diabetes mellitus with moderate nonproliferative diabetic retinopathy without macular edema, bilateral: Secondary | ICD-10-CM | POA: Diagnosis not present

## 2022-08-23 DIAGNOSIS — E139 Other specified diabetes mellitus without complications: Secondary | ICD-10-CM | POA: Diagnosis not present

## 2022-08-29 DIAGNOSIS — I10 Essential (primary) hypertension: Secondary | ICD-10-CM | POA: Diagnosis not present

## 2022-08-29 DIAGNOSIS — Z794 Long term (current) use of insulin: Secondary | ICD-10-CM | POA: Diagnosis not present

## 2022-08-29 DIAGNOSIS — E7800 Pure hypercholesterolemia, unspecified: Secondary | ICD-10-CM | POA: Diagnosis not present

## 2022-08-29 DIAGNOSIS — E1165 Type 2 diabetes mellitus with hyperglycemia: Secondary | ICD-10-CM | POA: Diagnosis not present

## 2022-09-17 DIAGNOSIS — H8113 Benign paroxysmal vertigo, bilateral: Secondary | ICD-10-CM | POA: Diagnosis not present

## 2022-09-17 DIAGNOSIS — Z87898 Personal history of other specified conditions: Secondary | ICD-10-CM | POA: Diagnosis not present

## 2022-09-17 DIAGNOSIS — H6121 Impacted cerumen, right ear: Secondary | ICD-10-CM | POA: Diagnosis not present

## 2022-09-23 DIAGNOSIS — E139 Other specified diabetes mellitus without complications: Secondary | ICD-10-CM | POA: Diagnosis not present

## 2022-10-23 DIAGNOSIS — E139 Other specified diabetes mellitus without complications: Secondary | ICD-10-CM | POA: Diagnosis not present

## 2022-11-05 DIAGNOSIS — E1165 Type 2 diabetes mellitus with hyperglycemia: Secondary | ICD-10-CM | POA: Diagnosis not present

## 2022-11-05 DIAGNOSIS — E139 Other specified diabetes mellitus without complications: Secondary | ICD-10-CM | POA: Diagnosis not present

## 2022-11-05 DIAGNOSIS — E7800 Pure hypercholesterolemia, unspecified: Secondary | ICD-10-CM | POA: Diagnosis not present

## 2022-11-05 DIAGNOSIS — I10 Essential (primary) hypertension: Secondary | ICD-10-CM | POA: Diagnosis not present

## 2022-11-05 DIAGNOSIS — R350 Frequency of micturition: Secondary | ICD-10-CM | POA: Diagnosis not present

## 2022-11-23 DIAGNOSIS — E139 Other specified diabetes mellitus without complications: Secondary | ICD-10-CM | POA: Diagnosis not present

## 2022-12-17 DIAGNOSIS — I10 Essential (primary) hypertension: Secondary | ICD-10-CM | POA: Diagnosis not present

## 2022-12-17 DIAGNOSIS — E7800 Pure hypercholesterolemia, unspecified: Secondary | ICD-10-CM | POA: Diagnosis not present

## 2022-12-17 DIAGNOSIS — E559 Vitamin D deficiency, unspecified: Secondary | ICD-10-CM | POA: Diagnosis not present

## 2022-12-17 DIAGNOSIS — E1165 Type 2 diabetes mellitus with hyperglycemia: Secondary | ICD-10-CM | POA: Diagnosis not present

## 2022-12-18 DIAGNOSIS — E559 Vitamin D deficiency, unspecified: Secondary | ICD-10-CM | POA: Diagnosis not present

## 2022-12-18 DIAGNOSIS — I10 Essential (primary) hypertension: Secondary | ICD-10-CM | POA: Diagnosis not present

## 2022-12-18 DIAGNOSIS — Z0000 Encounter for general adult medical examination without abnormal findings: Secondary | ICD-10-CM | POA: Diagnosis not present

## 2022-12-18 DIAGNOSIS — I700 Atherosclerosis of aorta: Secondary | ICD-10-CM | POA: Diagnosis not present

## 2022-12-18 DIAGNOSIS — E1165 Type 2 diabetes mellitus with hyperglycemia: Secondary | ICD-10-CM | POA: Diagnosis not present

## 2022-12-18 DIAGNOSIS — E7800 Pure hypercholesterolemia, unspecified: Secondary | ICD-10-CM | POA: Diagnosis not present

## 2022-12-18 DIAGNOSIS — I2510 Atherosclerotic heart disease of native coronary artery without angina pectoris: Secondary | ICD-10-CM | POA: Diagnosis not present

## 2022-12-23 DIAGNOSIS — E139 Other specified diabetes mellitus without complications: Secondary | ICD-10-CM | POA: Diagnosis not present

## 2022-12-24 ENCOUNTER — Encounter: Payer: Self-pay | Admitting: Cardiology

## 2023-01-23 DIAGNOSIS — E139 Other specified diabetes mellitus without complications: Secondary | ICD-10-CM | POA: Diagnosis not present

## 2023-01-28 DIAGNOSIS — R42 Dizziness and giddiness: Secondary | ICD-10-CM | POA: Diagnosis not present

## 2023-01-28 DIAGNOSIS — I10 Essential (primary) hypertension: Secondary | ICD-10-CM | POA: Diagnosis not present

## 2023-02-23 DIAGNOSIS — E139 Other specified diabetes mellitus without complications: Secondary | ICD-10-CM | POA: Diagnosis not present

## 2023-02-25 DIAGNOSIS — N898 Other specified noninflammatory disorders of vagina: Secondary | ICD-10-CM | POA: Diagnosis not present

## 2023-02-25 DIAGNOSIS — I10 Essential (primary) hypertension: Secondary | ICD-10-CM | POA: Diagnosis not present

## 2023-02-25 DIAGNOSIS — E139 Other specified diabetes mellitus without complications: Secondary | ICD-10-CM | POA: Diagnosis not present

## 2023-02-25 DIAGNOSIS — E7800 Pure hypercholesterolemia, unspecified: Secondary | ICD-10-CM | POA: Diagnosis not present

## 2023-03-25 DIAGNOSIS — E139 Other specified diabetes mellitus without complications: Secondary | ICD-10-CM | POA: Diagnosis not present

## 2023-04-03 DIAGNOSIS — E7800 Pure hypercholesterolemia, unspecified: Secondary | ICD-10-CM | POA: Diagnosis not present

## 2023-04-03 DIAGNOSIS — N898 Other specified noninflammatory disorders of vagina: Secondary | ICD-10-CM | POA: Diagnosis not present

## 2023-04-03 DIAGNOSIS — E139 Other specified diabetes mellitus without complications: Secondary | ICD-10-CM | POA: Diagnosis not present

## 2023-04-03 DIAGNOSIS — E1165 Type 2 diabetes mellitus with hyperglycemia: Secondary | ICD-10-CM | POA: Diagnosis not present

## 2023-04-03 DIAGNOSIS — I10 Essential (primary) hypertension: Secondary | ICD-10-CM | POA: Diagnosis not present

## 2023-04-15 DIAGNOSIS — Z113 Encounter for screening for infections with a predominantly sexual mode of transmission: Secondary | ICD-10-CM | POA: Diagnosis not present

## 2023-04-15 DIAGNOSIS — N762 Acute vulvitis: Secondary | ICD-10-CM | POA: Diagnosis not present

## 2023-04-15 DIAGNOSIS — N950 Postmenopausal bleeding: Secondary | ICD-10-CM | POA: Diagnosis not present

## 2023-04-15 DIAGNOSIS — R300 Dysuria: Secondary | ICD-10-CM | POA: Diagnosis not present

## 2023-04-17 DIAGNOSIS — Z794 Long term (current) use of insulin: Secondary | ICD-10-CM | POA: Diagnosis not present

## 2023-04-17 DIAGNOSIS — E113311 Type 2 diabetes mellitus with moderate nonproliferative diabetic retinopathy with macular edema, right eye: Secondary | ICD-10-CM | POA: Diagnosis not present

## 2023-04-17 DIAGNOSIS — H40013 Open angle with borderline findings, low risk, bilateral: Secondary | ICD-10-CM | POA: Diagnosis not present

## 2023-04-17 DIAGNOSIS — E113392 Type 2 diabetes mellitus with moderate nonproliferative diabetic retinopathy without macular edema, left eye: Secondary | ICD-10-CM | POA: Diagnosis not present

## 2023-04-18 DIAGNOSIS — E139 Other specified diabetes mellitus without complications: Secondary | ICD-10-CM | POA: Diagnosis not present

## 2023-04-26 IMAGING — CT CT CARDIAC CORONARY ARTERY CALCIUM SCORE
3 series · 14 of 20 positions shown, 16 images · non-contrast
Comparison: None

CLINICAL DATA: 65-year-old African American female with history of
high cholesterol, hypertension and diabetes.

EXAM:
CT CARDIAC CORONARY ARTERY CALCIUM SCORE
TECHNIQUE: Non-contrast imaging through the heart was performed using
prospective ECG gating. Image post processing was performed on an
independent workstation, allowing for quantitative analysis of the
heart and coronary arteries. Note that this exam targets the heart
and the chest was not imaged in its entirety.

[Series 2: calcium scoring 2.00 qr36 bestdiast 68% hrt calciu · axial · 0.31mm/px · z∈[+1871,+1943]mm · 4 of 61 slices shown]
[im 13/61  vessel]
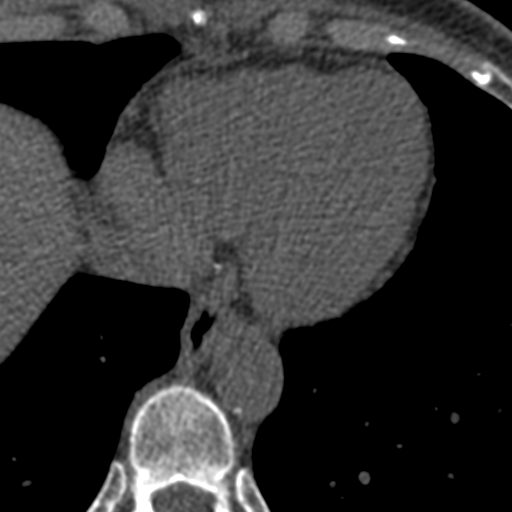
[im 25/61  vessel]
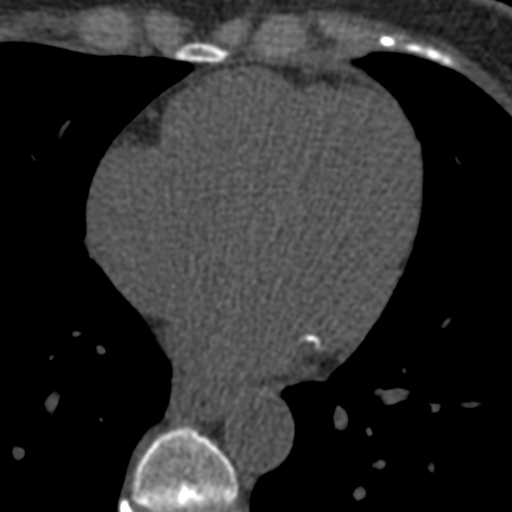
[im 37/61  vessel]
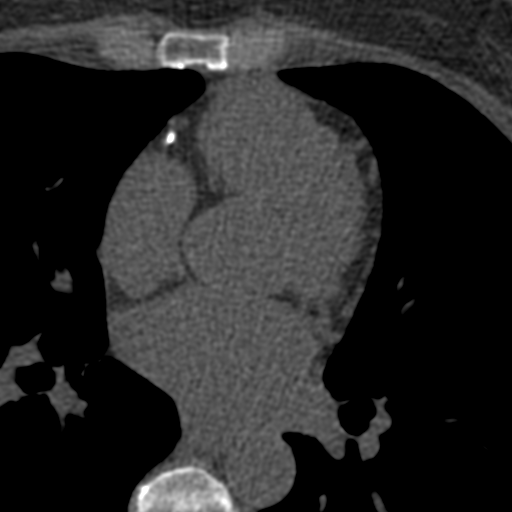
[im 49/61  vessel]
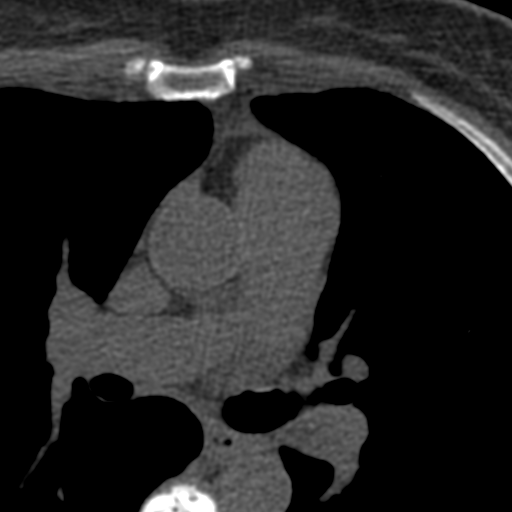

[Series 3: calcium scoring 2.00 br40 bestdiast 68% axial · axial · 0.53mm/px · z∈[+1867,+1947]mm · 5 of 61 slices shown, 7 images]
[im 11/61  vessel]
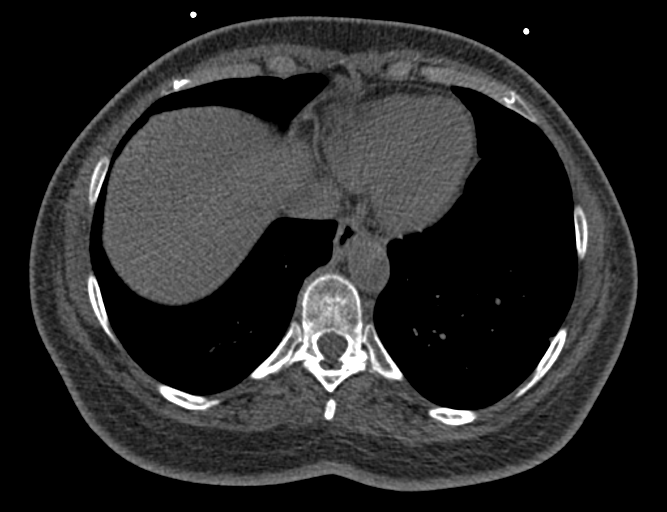
[im 11/61  lung]
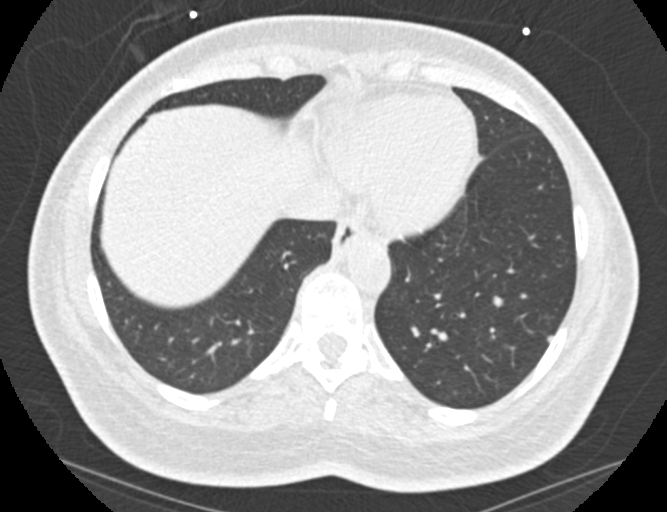
[im 21/61  vessel]
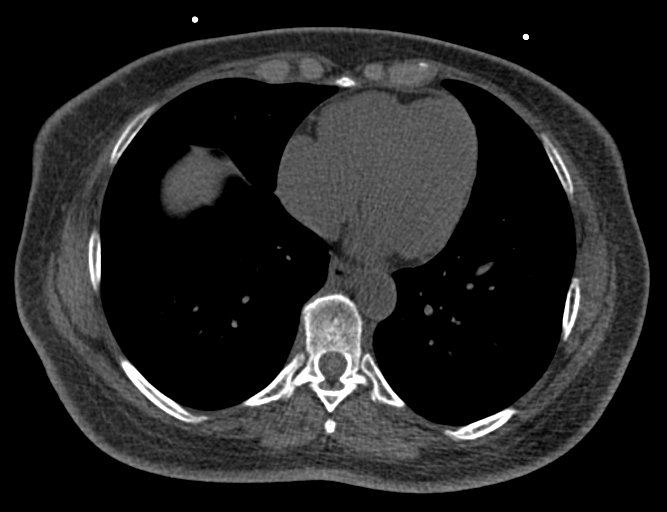
[im 31/61  vessel]
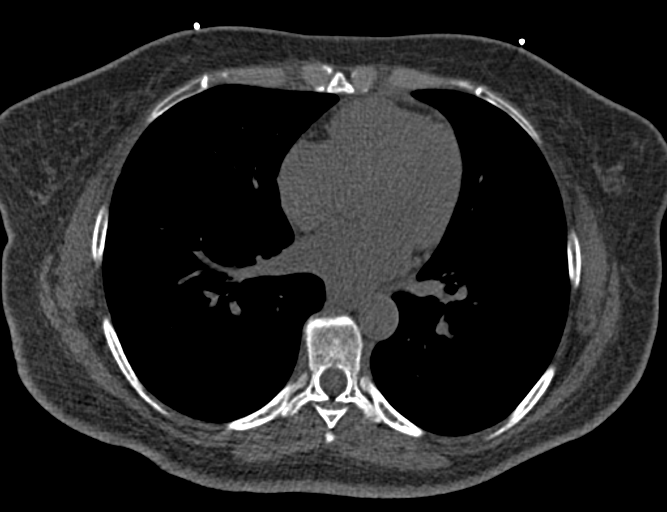
[im 41/61  vessel]
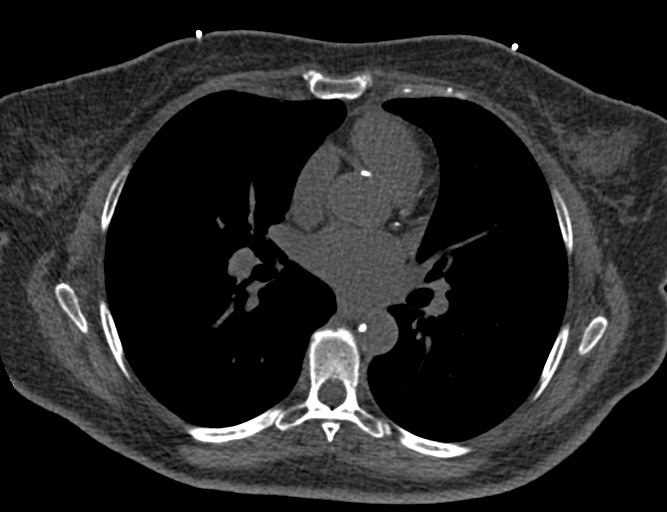
[im 51/61  vessel]
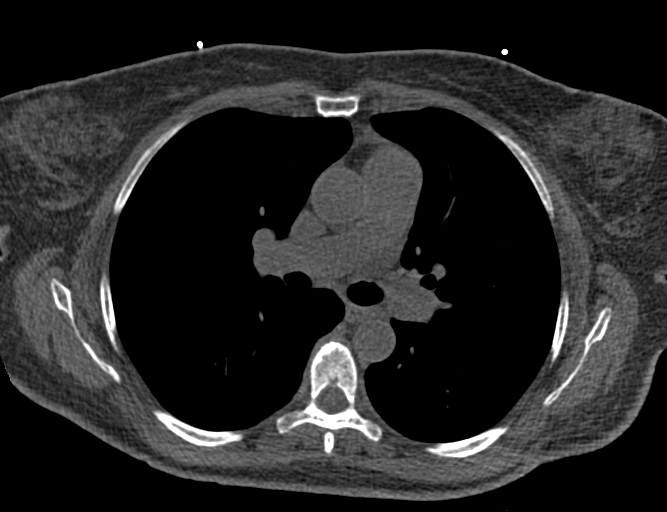
[im 51/61  lung]
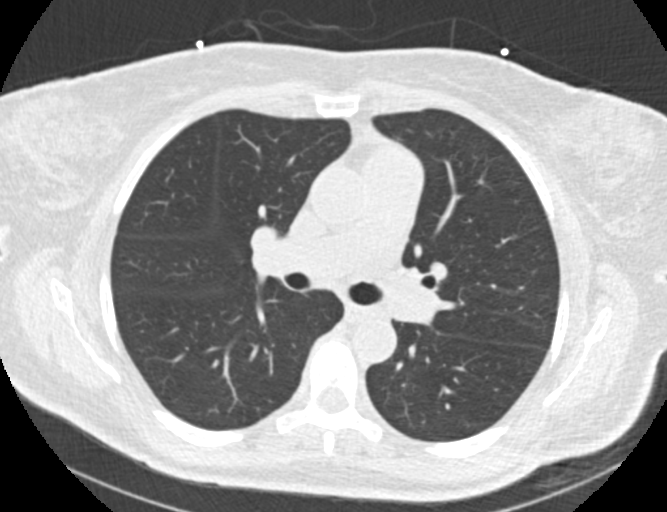

[Series 9: calcium scoring 2.00 br60 bestdiast 68% lungs · axial · 0.53mm/px · z∈[+1867,+1947]mm · 5 of 61 slices shown]
[im 11/61  vessel]
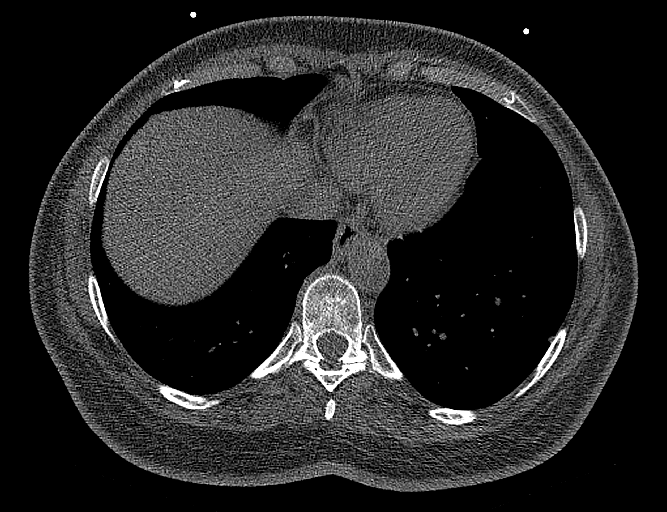
[im 21/61  vessel]
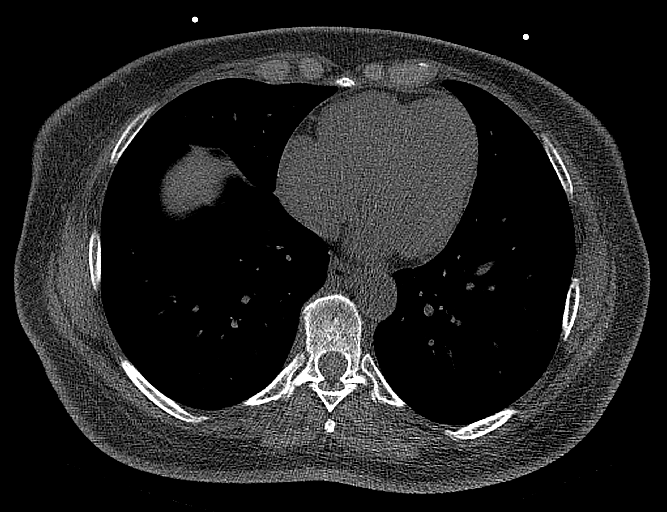
[im 31/61  vessel]
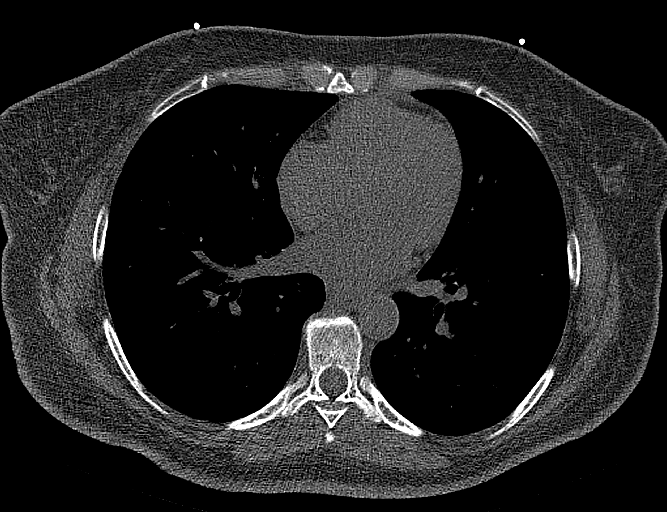
[im 41/61  vessel]
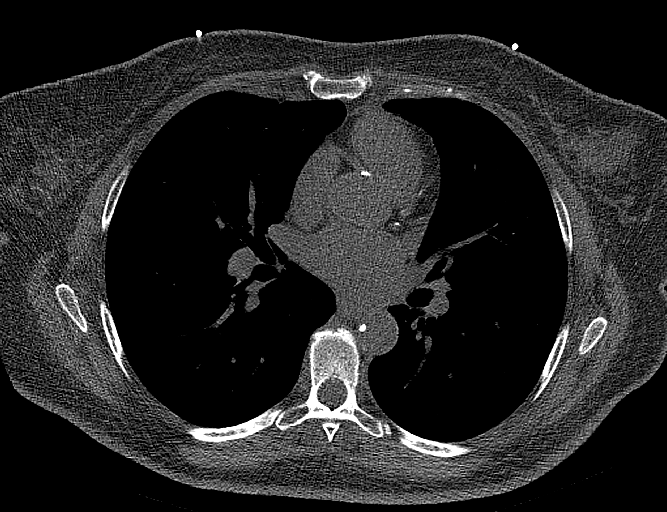
[im 51/61  vessel]
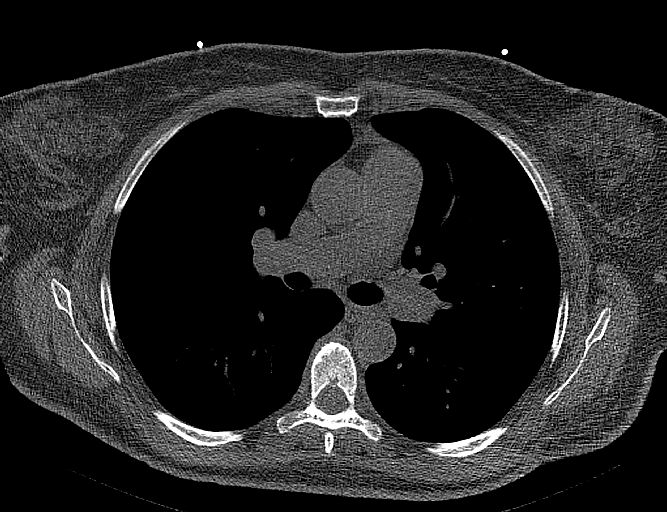

[14 of 20 positions shown; findings below may reference images not displayed]

FINDINGS: CORONARY CALCIUM SCORES:

Left Main: 0

LAD: 138

LCx:

RCA:

Total Agatston Score: 268

[HOSPITAL] percentile: 92

AORTA MEASUREMENTS:

Ascending Aorta: 29 mm

Descending Aorta: 22 mm

OTHER FINDINGS:

Cardiovascular: Normal heart size without pericardial effusion.
Calcified aortic atherosclerosis. Normal caliber central pulmonary
vessels. Limited assessment of cardiovascular structures given lack
of intravenous contrast.

Mediastinum/Nodes: Imaged portions of the mediastinum are
unremarkable, no signs of adenopathy. Mildly patulous esophagus.

Lungs/Pleura: No consolidation or effusion. Airways are patent with
respect to visualized portions of the chest. 3 mm nodule at the LEFT
lung base (image 51/9)

Upper Abdomen: Possible mild hepatic steatosis. Liver is
incompletely imaged.

Musculoskeletal: No acute musculoskeletal findings or destructive
bone process.
IMPRESSION: 1. Coronary artery calcium score of 268. This places the patient in
the 92nd percentile for subjects of the same age, gender and
race/ethnicity.
2. 3 mm LEFT lower lobe pulmonary nodule. No follow-up needed if
patient is low-risk. Non-contrast chest CT can be considered in 12
months if patient is high-risk. This recommendation follows the
consensus statement: Guidelines for Management of Incidental
Pulmonary Nodules Detected on CT Images: From the [HOSPITAL]
3. Possible mild hepatic steatosis.
4. Aortic atherosclerosis.

Aortic Atherosclerosis (CP6YW-OVZ.Z).

## 2023-05-01 ENCOUNTER — Other Ambulatory Visit (HOSPITAL_BASED_OUTPATIENT_CLINIC_OR_DEPARTMENT_OTHER): Payer: Self-pay | Admitting: Obstetrics and Gynecology

## 2023-05-01 DIAGNOSIS — R1900 Intra-abdominal and pelvic swelling, mass and lump, unspecified site: Secondary | ICD-10-CM | POA: Diagnosis not present

## 2023-05-01 DIAGNOSIS — N950 Postmenopausal bleeding: Secondary | ICD-10-CM | POA: Diagnosis not present

## 2023-05-06 DIAGNOSIS — E7800 Pure hypercholesterolemia, unspecified: Secondary | ICD-10-CM | POA: Diagnosis not present

## 2023-05-06 DIAGNOSIS — I10 Essential (primary) hypertension: Secondary | ICD-10-CM | POA: Diagnosis not present

## 2023-05-06 DIAGNOSIS — Z794 Long term (current) use of insulin: Secondary | ICD-10-CM | POA: Diagnosis not present

## 2023-05-06 DIAGNOSIS — E139 Other specified diabetes mellitus without complications: Secondary | ICD-10-CM | POA: Diagnosis not present

## 2023-05-13 ENCOUNTER — Ambulatory Visit (HOSPITAL_BASED_OUTPATIENT_CLINIC_OR_DEPARTMENT_OTHER): Admission: RE | Admit: 2023-05-13 | Payer: Medicare PPO | Source: Ambulatory Visit

## 2023-05-13 ENCOUNTER — Telehealth (HOSPITAL_BASED_OUTPATIENT_CLINIC_OR_DEPARTMENT_OTHER): Payer: Self-pay | Admitting: Obstetrics and Gynecology

## 2023-05-18 DIAGNOSIS — E139 Other specified diabetes mellitus without complications: Secondary | ICD-10-CM | POA: Diagnosis not present

## 2023-06-17 DIAGNOSIS — E1165 Type 2 diabetes mellitus with hyperglycemia: Secondary | ICD-10-CM | POA: Diagnosis not present

## 2023-06-18 DIAGNOSIS — E139 Other specified diabetes mellitus without complications: Secondary | ICD-10-CM | POA: Diagnosis not present

## 2023-06-18 LAB — LAB REPORT - SCANNED
A1c: 7.7
Calcium: 9.5
EGFR: 69

## 2023-06-24 DIAGNOSIS — I700 Atherosclerosis of aorta: Secondary | ICD-10-CM | POA: Diagnosis not present

## 2023-06-24 DIAGNOSIS — I10 Essential (primary) hypertension: Secondary | ICD-10-CM | POA: Diagnosis not present

## 2023-06-24 DIAGNOSIS — I2510 Atherosclerotic heart disease of native coronary artery without angina pectoris: Secondary | ICD-10-CM | POA: Diagnosis not present

## 2023-06-24 DIAGNOSIS — E1165 Type 2 diabetes mellitus with hyperglycemia: Secondary | ICD-10-CM | POA: Diagnosis not present

## 2023-06-24 DIAGNOSIS — E7800 Pure hypercholesterolemia, unspecified: Secondary | ICD-10-CM | POA: Diagnosis not present

## 2023-07-01 DIAGNOSIS — E139 Other specified diabetes mellitus without complications: Secondary | ICD-10-CM | POA: Diagnosis not present

## 2023-07-01 DIAGNOSIS — E7800 Pure hypercholesterolemia, unspecified: Secondary | ICD-10-CM | POA: Diagnosis not present

## 2023-07-01 DIAGNOSIS — I10 Essential (primary) hypertension: Secondary | ICD-10-CM | POA: Diagnosis not present

## 2023-07-01 DIAGNOSIS — Z794 Long term (current) use of insulin: Secondary | ICD-10-CM | POA: Diagnosis not present

## 2023-07-03 ENCOUNTER — Other Ambulatory Visit: Payer: Self-pay | Admitting: Internal Medicine

## 2023-07-03 DIAGNOSIS — Z1231 Encounter for screening mammogram for malignant neoplasm of breast: Secondary | ICD-10-CM

## 2023-07-10 ENCOUNTER — Other Ambulatory Visit: Payer: Self-pay | Admitting: Obstetrics and Gynecology

## 2023-07-10 DIAGNOSIS — R1900 Intra-abdominal and pelvic swelling, mass and lump, unspecified site: Secondary | ICD-10-CM

## 2023-07-15 ENCOUNTER — Ambulatory Visit
Admission: RE | Admit: 2023-07-15 | Discharge: 2023-07-15 | Disposition: A | Discharge status: Home / Self Care | Payer: Medicare PPO | Source: Ambulatory Visit | Attending: Internal Medicine | Admitting: Internal Medicine

## 2023-07-15 DIAGNOSIS — Z1231 Encounter for screening mammogram for malignant neoplasm of breast: Secondary | ICD-10-CM

## 2023-07-19 DIAGNOSIS — E139 Other specified diabetes mellitus without complications: Secondary | ICD-10-CM | POA: Diagnosis not present

## 2023-07-21 ENCOUNTER — Other Ambulatory Visit: Payer: Self-pay | Admitting: Internal Medicine

## 2023-07-21 DIAGNOSIS — R928 Other abnormal and inconclusive findings on diagnostic imaging of breast: Secondary | ICD-10-CM

## 2023-07-31 ENCOUNTER — Ambulatory Visit
Admission: RE | Admit: 2023-07-31 | Discharge: 2023-07-31 | Disposition: A | Discharge status: Home / Self Care | Payer: Medicare PPO | Source: Ambulatory Visit | Attending: Internal Medicine | Admitting: Internal Medicine

## 2023-07-31 DIAGNOSIS — R928 Other abnormal and inconclusive findings on diagnostic imaging of breast: Secondary | ICD-10-CM | POA: Diagnosis not present

## 2023-08-12 ENCOUNTER — Ambulatory Visit
Admission: RE | Admit: 2023-08-12 | Discharge: 2023-08-12 | Disposition: A | Discharge status: Home / Self Care | Payer: Medicare PPO | Source: Ambulatory Visit | Attending: Obstetrics and Gynecology | Admitting: Obstetrics and Gynecology

## 2023-08-12 DIAGNOSIS — R1900 Intra-abdominal and pelvic swelling, mass and lump, unspecified site: Secondary | ICD-10-CM | POA: Diagnosis not present

## 2023-08-12 DIAGNOSIS — I700 Atherosclerosis of aorta: Secondary | ICD-10-CM | POA: Diagnosis not present

## 2023-08-12 MED ORDER — IOPAMIDOL (ISOVUE-300) INJECTION 61%
100.0000 mL | Freq: Once | INTRAVENOUS | Status: AC | PRN
  Administered 2023-08-12: 100 mL via INTRAVENOUS

## 2023-08-16 DIAGNOSIS — E139 Other specified diabetes mellitus without complications: Secondary | ICD-10-CM | POA: Diagnosis not present

## 2023-09-16 DIAGNOSIS — E139 Other specified diabetes mellitus without complications: Secondary | ICD-10-CM | POA: Diagnosis not present

## 2023-09-17 DIAGNOSIS — N840 Polyp of corpus uteri: Secondary | ICD-10-CM | POA: Diagnosis not present

## 2023-10-01 DIAGNOSIS — E1165 Type 2 diabetes mellitus with hyperglycemia: Secondary | ICD-10-CM | POA: Diagnosis not present

## 2023-10-01 DIAGNOSIS — I10 Essential (primary) hypertension: Secondary | ICD-10-CM | POA: Diagnosis not present

## 2023-10-01 DIAGNOSIS — E7800 Pure hypercholesterolemia, unspecified: Secondary | ICD-10-CM | POA: Diagnosis not present

## 2023-10-01 DIAGNOSIS — E559 Vitamin D deficiency, unspecified: Secondary | ICD-10-CM | POA: Diagnosis not present

## 2023-10-07 DIAGNOSIS — E139 Other specified diabetes mellitus without complications: Secondary | ICD-10-CM | POA: Diagnosis not present

## 2023-10-07 DIAGNOSIS — E7800 Pure hypercholesterolemia, unspecified: Secondary | ICD-10-CM | POA: Diagnosis not present

## 2023-10-07 DIAGNOSIS — Z794 Long term (current) use of insulin: Secondary | ICD-10-CM | POA: Diagnosis not present

## 2023-10-07 DIAGNOSIS — I2510 Atherosclerotic heart disease of native coronary artery without angina pectoris: Secondary | ICD-10-CM | POA: Diagnosis not present

## 2023-10-07 DIAGNOSIS — I10 Essential (primary) hypertension: Secondary | ICD-10-CM | POA: Diagnosis not present

## 2023-10-09 ENCOUNTER — Encounter (HOSPITAL_COMMUNITY): Payer: Self-pay | Admitting: Obstetrics and Gynecology

## 2023-10-09 ENCOUNTER — Other Ambulatory Visit: Payer: Self-pay

## 2023-10-09 NOTE — Progress Notes (Signed)
 PCP - Imelda Man, MD  Cardiologist -   PPM/ICD - denies Device Orders - n/a Rep Notified - n/a  Chest x-ray -  Chest CT 02-14-22 EKG - DOS Stress Test - 01-15-21 ECHO - 01-01-21 Cardiac Cath -   CPAP - denies    Fasting Blood Sugar - Per patient blood sugars are between 100-123 Checks Blood Sugar Dexcom left arm Omipod to abdomen  Blood Thinner Instructions: denies Aspirin  Instructions: n/a  ERAS Protcol - clear liquids until 9:00am.  COVID TEST- n/a  Anesthesia review: no  Patient verbally denies any shortness of breath, fever, cough and chest pain during phone call   -------------  SDW INSTRUCTIONS given:  Your procedure is scheduled on Oct 13, 2023.  Report to [facility] Main Entrance "A" at 9:30 A.M., and check in at the Admitting office.  Call this number if you have problems the morning of surgery:  [phone]   Remember:  Do not eat after midnight the night before your surgery  You may drink clear liquids until 9:00 the morning of your surgery.   Clear liquids allowed are: Water, Non-Citrus Juices (without pulp), Carbonated Beverages, Clear Tea, Black Coffee Only, and Gatorade    Take these medicines the morning of surgery with A SIP OF WATER  amLODipine -benazepril  (LOTREL)  rosuvastatin (CRESTOR)    As of today, STOP taking any Aspirin  (unless otherwise instructed by your surgeon) Aleve, Naproxen, Ibuprofen, Motrin, Advil, Goody's, BC's, all herbal medications, fish oil, and all vitamins.          WHAT DO I DO ABOUT MY DIABETES MEDICATION?   Do not take oral diabetes medicines  the morning of surgery :.dapagliflozin propanediol (FARXIGA)  Last dose 10-09-23   OMNIPOD to abdomen  Dexcom to left arm  The day of surgery, do not take other diabetes injectables, including Byetta (exenatide), Bydureon (exenatide ER), Victoza (liraglutide), or Trulicity (dulaglutide).  If your CBG is greater than 220 mg/dL, you may take  of your sliding scale  (correction) dose of insulin .   HOW TO MANAGE YOUR DIABETES BEFORE AND AFTER SURGERY  Why is it important to control my blood sugar before and after surgery? Improving blood sugar levels before and after surgery helps healing and can limit problems. A way of improving blood sugar control is eating a healthy diet by:  Eating less sugar and carbohydrates  Increasing activity/exercise  Talking with your doctor about reaching your blood sugar goals High blood sugars (greater than 180 mg/dL) can raise your risk of infections and slow your recovery, so you will need to focus on controlling your diabetes during the weeks before surgery. Make sure that the doctor who takes care of your diabetes knows about your planned surgery including the date and location.  How do I manage my blood sugar before surgery? Check your blood sugar at least 4 times a day, starting 2 days before surgery, to make sure that the level is not too high or low.  Check your blood sugar the morning of your surgery when you wake up and every 2 hours until you get to the Short Stay unit.  If your blood sugar is less than 70 mg/dL, you will need to treat for low blood sugar: Do not take insulin . Treat a low blood sugar (less than 70 mg/dL) with  cup of clear juice (cranberry or apple), 4 glucose tablets, OR glucose gel. Recheck blood sugar in 15 minutes after treatment (to make sure it is greater than 70 mg/dL).  If your blood sugar is not greater than 70 mg/dL on recheck, call [phone] for further instructions. Report your blood sugar to the short stay nurse when you get to Short Stay.  If you are admitted to the hospital after surgery: Your blood sugar will be checked by the staff and you will probably be given insulin  after surgery (instead of oral diabetes medicines) to make sure you have good blood sugar levels. The goal for blood sugar control after surgery is 80-180 mg/dL.             Do not wear jewelry, make up,  or nail polish            Do not wear lotions, powders, perfumes/colognes, or deodorant.            Do not shave 48 hours prior to surgery.  Men may shave face and neck.            Do not bring valuables to the hospital.            [facility] is not responsible for any belongings or valuables.  Do NOT Smoke (Tobacco/Vaping) 24 hours prior to your procedure If you use a CPAP at night, you may bring all equipment for your overnight stay.   Contacts, glasses, dentures or bridgework may not be worn into surgery.      For patients admitted to the hospital, discharge time will be determined by your treatment team.   Patients discharged the day of surgery will not be allowed to drive home, and someone needs to stay with them for 24 hours.    Special instructions:   [facility]- Preparing For Surgery  Before surgery, you can play an important role. Because skin is not sterile, your skin needs to be as free of germs as possible. You can reduce the number of germs on your skin by washing with CHG (chlorahexidine gluconate) Soap before surgery.  CHG is an antiseptic cleaner which kills germs and bonds with the skin to continue killing germs even after washing.    Oral Hygiene is also important to reduce your risk of infection.  Remember - BRUSH YOUR TEETH THE MORNING OF SURGERY WITH YOUR REGULAR TOOTHPASTE  Please do not use if you have an allergy to CHG or antibacterial soaps. If your skin becomes reddened/irritated stop using the CHG.  Do not shave (including legs and underarms) for at least 48 hours prior to first CHG shower. It is OK to shave your face.  Please follow these instructions carefully.   Shower the NIGHT BEFORE SURGERY and the MORNING OF SURGERY with DIAL Soap.   Pat yourself dry with a CLEAN TOWEL.  Wear CLEAN PAJAMAS to bed the night before surgery  Place CLEAN SHEETS on your bed the night of your first shower and DO NOT SLEEP WITH PETS.   Day of Surgery: Please shower  morning of surgery  Wear Clean/Comfortable clothing the morning of surgery Do not apply any deodorants/lotions.   Remember to brush your teeth WITH YOUR REGULAR TOOTHPASTE.   Questions were answered. Patient verbalized understanding of instructions.

## 2023-10-13 ENCOUNTER — Ambulatory Visit (HOSPITAL_COMMUNITY)

## 2023-10-13 ENCOUNTER — Other Ambulatory Visit: Payer: Self-pay

## 2023-10-13 ENCOUNTER — Encounter (HOSPITAL_COMMUNITY): Payer: Self-pay | Admitting: Obstetrics and Gynecology

## 2023-10-13 ENCOUNTER — Ambulatory Visit (HOSPITAL_BASED_OUTPATIENT_CLINIC_OR_DEPARTMENT_OTHER)

## 2023-10-13 ENCOUNTER — Ambulatory Visit (HOSPITAL_COMMUNITY)
Admission: RE | Admit: 2023-10-13 | Discharge: 2023-10-13 | Disposition: A | Discharge status: Home / Self Care | Attending: Obstetrics and Gynecology | Admitting: Obstetrics and Gynecology

## 2023-10-13 ENCOUNTER — Encounter (HOSPITAL_COMMUNITY)
Admission: RE | Disposition: A | Discharge status: Home / Self Care | Payer: Self-pay | Source: Home / Self Care | Attending: Obstetrics and Gynecology

## 2023-10-13 DIAGNOSIS — Z794 Long term (current) use of insulin: Secondary | ICD-10-CM | POA: Diagnosis not present

## 2023-10-13 DIAGNOSIS — I10 Essential (primary) hypertension: Secondary | ICD-10-CM

## 2023-10-13 DIAGNOSIS — Z7984 Long term (current) use of oral hypoglycemic drugs: Secondary | ICD-10-CM | POA: Insufficient documentation

## 2023-10-13 DIAGNOSIS — E119 Type 2 diabetes mellitus without complications: Secondary | ICD-10-CM

## 2023-10-13 DIAGNOSIS — Z87891 Personal history of nicotine dependence: Secondary | ICD-10-CM

## 2023-10-13 DIAGNOSIS — Z01818 Encounter for other preprocedural examination: Secondary | ICD-10-CM

## 2023-10-13 DIAGNOSIS — E11319 Type 2 diabetes mellitus with unspecified diabetic retinopathy without macular edema: Secondary | ICD-10-CM | POA: Diagnosis not present

## 2023-10-13 DIAGNOSIS — N840 Polyp of corpus uteri: Secondary | ICD-10-CM | POA: Insufficient documentation

## 2023-10-13 DIAGNOSIS — N950 Postmenopausal bleeding: Secondary | ICD-10-CM | POA: Insufficient documentation

## 2023-10-13 HISTORY — PX: DILATATION & CURRETTAGE/HYSTEROSCOPY WITH RESECTOCOPE: SHX5572

## 2023-10-13 LAB — CBC
HCT: 35.9 % — ABNORMAL LOW (ref 36.0–46.0)
Hemoglobin: 11.4 g/dL — ABNORMAL LOW (ref 12.0–15.0)
MCH: 30.8 pg (ref 26.0–34.0)
MCHC: 31.8 g/dL (ref 30.0–36.0)
MCV: 97 fL (ref 80.0–100.0)
Platelets: 168 10*3/uL (ref 150–400)
RBC: 3.7 MIL/uL — ABNORMAL LOW (ref 3.87–5.11)
RDW: 13.1 % (ref 11.5–15.5)
WBC: 4.1 10*3/uL (ref 4.0–10.5)
nRBC: 0 % (ref 0.0–0.2)

## 2023-10-13 LAB — GLUCOSE, CAPILLARY
Glucose-Capillary: 146 mg/dL — ABNORMAL HIGH (ref 70–99)
Glucose-Capillary: 114 mg/dL — ABNORMAL HIGH (ref 70–99)
Glucose-Capillary: 104 mg/dL — ABNORMAL HIGH (ref 70–99)

## 2023-10-13 LAB — BASIC METABOLIC PANEL WITH GFR
Anion gap: 8 (ref 5–15)
BUN: 17 mg/dL (ref 8–23)
CO2: 23 mmol/L (ref 22–32)
Calcium: 8.7 mg/dL — ABNORMAL LOW (ref 8.9–10.3)
Chloride: 106 mmol/L (ref 98–111)
Creatinine, Ser: 0.87 mg/dL (ref 0.44–1.00)
GFR, Estimated: >60 mL/min (ref >60)
Glucose, Bld: 126 mg/dL — ABNORMAL HIGH (ref 70–99)
Potassium: 3.9 mmol/L (ref 3.5–5.1)
Sodium: 137 mmol/L (ref 135–145)

## 2023-10-13 LAB — ABO/RH: ABO/RH(D): O POS

## 2023-10-13 LAB — TYPE AND SCREEN
ABO/RH(D): O POS
Antibody Screen: NEGATIVE

## 2023-10-13 SURGERY — DILATATION & CURETTAGE/HYSTEROSCOPY WITH RESECTOCOPE
Anesthesia: General | Site: Vagina

## 2023-10-13 MED ORDER — HYDROMORPHONE HCL 1 MG/ML IJ SOLN
INTRAMUSCULAR | Status: AC
  Filled 2023-10-13: qty 1

## 2023-10-13 MED ORDER — MIDAZOLAM HCL 2 MG/2ML IJ SOLN
INTRAMUSCULAR | Status: DC | PRN
  Administered 2023-10-13: 2 mg via INTRAVENOUS

## 2023-10-13 MED ORDER — ACETAMINOPHEN 325 MG PO TABS: 650.0000 mg | ORAL_TABLET | Freq: Four times a day (QID) | ORAL | Status: AC | PRN

## 2023-10-13 MED ORDER — FENTANYL CITRATE (PF) 250 MCG/5ML IJ SOLN
INTRAMUSCULAR | Status: AC
  Filled 2023-10-13: qty 5

## 2023-10-13 MED ORDER — TRANEXAMIC ACID-NACL 1000-0.7 MG/100ML-% IV SOLN
INTRAVENOUS | Status: AC
  Filled 2023-10-13: qty 100

## 2023-10-13 MED ORDER — LIDOCAINE HCL 1 % IJ SOLN
INTRAMUSCULAR | Status: DC | PRN
  Administered 2023-10-13: 10 mL

## 2023-10-13 MED ORDER — LIDOCAINE 2% (20 MG/ML) 5 ML SYRINGE
INTRAMUSCULAR | Status: DC | PRN
  Administered 2023-10-13: 60 mg via INTRAVENOUS

## 2023-10-13 MED ORDER — MIDAZOLAM HCL 2 MG/2ML IJ SOLN
INTRAMUSCULAR | Status: AC
  Filled 2023-10-13: qty 2

## 2023-10-13 MED ORDER — TRANEXAMIC ACID-NACL 1000-0.7 MG/100ML-% IV SOLN
INTRAVENOUS | Status: DC | PRN
  Administered 2023-10-13: 1000 mg via INTRAVENOUS

## 2023-10-13 MED ORDER — CHLORHEXIDINE GLUCONATE 0.12 % MT SOLN: 15.0000 mL | Freq: Once | OROMUCOSAL | Status: AC

## 2023-10-13 MED ORDER — MEPERIDINE HCL 25 MG/ML IJ SOLN: 6.2500 mg | INTRAMUSCULAR | Status: DC | PRN

## 2023-10-13 MED ORDER — HYDROMORPHONE HCL 1 MG/ML IJ SOLN
0.2500 mg | INTRAMUSCULAR | Status: DC | PRN
  Administered 2023-10-13 (×2): 0.25 mg via INTRAVENOUS

## 2023-10-13 MED ORDER — PROPOFOL 10 MG/ML IV BOLUS
INTRAVENOUS | Status: DC | PRN
  Administered 2023-10-13: 200 mg via INTRAVENOUS

## 2023-10-13 MED ORDER — CHLORHEXIDINE GLUCONATE 0.12 % MT SOLN
OROMUCOSAL | Status: AC
Start: 2023-10-13 — End: 2023-10-13
  Administered 2023-10-13: 15 mL via OROMUCOSAL
  Filled 2023-10-13: qty 15

## 2023-10-13 MED ORDER — PROPOFOL 10 MG/ML IV BOLUS
INTRAVENOUS | Status: AC
  Filled 2023-10-13: qty 20

## 2023-10-13 MED ORDER — LIDOCAINE HCL 1 % IJ SOLN
INTRAMUSCULAR | Status: AC
  Filled 2023-10-13: qty 20

## 2023-10-13 MED ORDER — ACETAMINOPHEN 500 MG PO TABS
1000.0000 mg | ORAL_TABLET | ORAL | Status: AC
  Administered 2023-10-13: 1000 mg via ORAL
  Filled 2023-10-13: qty 2

## 2023-10-13 MED ORDER — OXYCODONE HCL 5 MG/5ML PO SOLN: 5.0000 mg | Freq: Once | ORAL | Status: DC | PRN

## 2023-10-13 MED ORDER — ONDANSETRON HCL 4 MG/2ML IJ SOLN
INTRAMUSCULAR | Status: DC | PRN
  Administered 2023-10-13: 4 mg via INTRAVENOUS

## 2023-10-13 MED ORDER — AMISULPRIDE (ANTIEMETIC) 5 MG/2ML IV SOLN: 10.0000 mg | Freq: Once | INTRAVENOUS | Status: DC | PRN

## 2023-10-13 MED ORDER — LIDOCAINE 2% (20 MG/ML) 5 ML SYRINGE
INTRAMUSCULAR | Status: AC
  Filled 2023-10-13: qty 5

## 2023-10-13 MED ORDER — OXYCODONE HCL 5 MG PO TABS: 5.0000 mg | ORAL_TABLET | Freq: Once | ORAL | Status: DC | PRN

## 2023-10-13 MED ORDER — FENTANYL CITRATE (PF) 250 MCG/5ML IJ SOLN
INTRAMUSCULAR | Status: DC | PRN
Start: 2023-10-13 — End: 2023-10-13
  Administered 2023-10-13: 50 ug via INTRAVENOUS

## 2023-10-13 MED ORDER — SODIUM CHLORIDE 0.9 % IV SOLN: 12.5000 mg | INTRAVENOUS | Status: DC | PRN

## 2023-10-13 MED ORDER — LACTATED RINGERS IV SOLN: INTRAVENOUS | Status: DC

## 2023-10-13 MED ORDER — IBUPROFEN 200 MG PO TABS: 600.0000 mg | ORAL_TABLET | Freq: Four times a day (QID) | ORAL | Status: AC | PRN

## 2023-10-13 MED ORDER — ORAL CARE MOUTH RINSE: 15.0000 mL | Freq: Once | OROMUCOSAL | Status: AC

## 2023-10-13 MED ORDER — ONDANSETRON HCL 4 MG/2ML IJ SOLN
INTRAMUSCULAR | Status: AC
  Filled 2023-10-13: qty 2

## 2023-10-13 SURGICAL SUPPLY — 15 items
CATH ROBINSON RED A/P 16FR (CATHETERS) IMPLANT
COVER MAYO STAND STRL (DRAPES) ×1 IMPLANT
DEVICE MYOSURE LITE (MISCELLANEOUS) IMPLANT
DEVICE MYOSURE REACH (MISCELLANEOUS) IMPLANT
GLOVE BIO SURGEON STRL SZ 6 (GLOVE) ×1 IMPLANT
GLOVE BIOGEL PI IND STRL 6.5 (GLOVE) ×1 IMPLANT
GLOVE SURG UNDER POLY LF SZ7 (GLOVE) ×1 IMPLANT
GOWN STRL REUS W/ TWL LRG LVL3 (GOWN DISPOSABLE) ×1 IMPLANT
KIT PROCEDURE FLUENT (KITS) ×1 IMPLANT
KIT TURNOVER KIT B (KITS) ×1 IMPLANT
PACK VAGINAL MINOR WOMEN LF (CUSTOM PROCEDURE TRAY) ×1 IMPLANT
PAD OB MATERNITY 11 LF (PERSONAL CARE ITEMS) ×1 IMPLANT
SEAL ROD LENS SCOPE MYOSURE (ABLATOR) ×1 IMPLANT
TOWEL GREEN STERILE FF (TOWEL DISPOSABLE) ×1 IMPLANT
UNDERPAD 30X36 HEAVY ABSORB (UNDERPADS AND DIAPERS) ×1 IMPLANT

## 2023-10-13 NOTE — Transfer of Care (Signed)
 Immediate Anesthesia Transfer of Care Note  Patient: Amy Luna  Procedure(s) Performed: DILATATION & CURETTAGE/HYSTEROSCOPY WITH RESECTOCOPE (Vagina )  Patient Location: PACU  Anesthesia Type:General  Level of Consciousness: awake  Airway & Oxygen Therapy: Patient Spontanous Breathing  Post-op Assessment: Report given to RN  Post vital signs: stable  Last Vitals:  Vitals Value Taken Time  BP 139/61 10/13/23 1302  Temp 36.5 10/13/23   1305  Pulse 48 10/13/23 1305  Resp 13 10/13/23 1305  SpO2 100 % 10/13/23 1305  Vitals shown include unfiled device data.  Last Pain:  Vitals:   10/13/23 1036  TempSrc:   PainSc: 0-No pain         Complications: No notable events documented.

## 2023-10-13 NOTE — Progress Notes (Signed)
 Notified Dr. Annabell Key of omnipod and CGM. Ok to leave on with no change to settings.

## 2023-10-13 NOTE — Anesthesia Postprocedure Evaluation (Signed)
 Anesthesia Post Note  Patient: Amy Luna  Procedure(s) Performed: DILATATION & CURETTAGE/HYSTEROSCOPY WITH RESECTOCOPE (Vagina )     Patient location during evaluation: PACU Anesthesia Type: General Level of consciousness: awake and alert Pain management: pain level controlled Vital Signs Assessment: post-procedure vital signs reviewed and stable Respiratory status: spontaneous breathing, nonlabored ventilation and respiratory function stable Cardiovascular status: blood pressure returned to baseline and stable Postop Assessment: no apparent nausea or vomiting Anesthetic complications: no   No notable events documented.  Last Vitals:  Vitals:   10/13/23 1345 10/13/23 1400  BP: (!) 182/62 (!) 176/79  Pulse: (!) 48 (!) 54  Resp: 14 14  Temp:  36.4 C  SpO2: 98% 98%    Last Pain:  Vitals:   10/13/23 1348  TempSrc:   PainSc: 6                  Earvin Goldberg

## 2023-10-13 NOTE — H&P (Signed)
 Amy Luna is an 68 y.o. female P0 with postmenopausal bleeding episodes from August - November 2023  No additional episodes of bleeding since 11/19 visit  04/15/23 problem visit with Dr. Shelvy Dickens "Pt c/o of itching on the vulva x 2 months. No new soaps or detergents. Pt c/o burning while urinating/MS Vulvar itching. No significant vaginal discharge. Some spotting - several times since August. Occ burning with urination" Bedside US  suspicious for endometrial polyp  05/01/23  - Reviewed GYN scan: 6cm anteverted uterus with 5.21mm EMS, 1.1cm projection into endometrial cavity suspicious for endometrial polyp. Complex hypoechoic mass 3.2 x 2.3 x 3.1 next to cervix on the left. Ovaries not visualized due to bowel gas bilaterally. Free fluid vs. anechoic tubular structure in left adnexa.  - Reviewed US  with Dr. Shelvy Dickens - Discussed findings with patient. For likely endometrial polyp and postmenopausal bleeding, recommend hysteroscopic polypectomy with D&C. However, unclear mass next to cervix on US  warrants further evaluation to determine source, GYN vs. GI?Aaron Aas Will get CT scan ASAP. Anticipate will need Gyn Onc involvement. CMP ordered for CT contrast.   08/12/23 CT scan normal, large stool burden    Pertinent Gynecological History: Menses: n/a Bleeding: post menopausal bleeding Contraception: post menopausal status DES exposure: denies Blood transfusions: none Sexually transmitted diseases: no past history Previous GYN Procedures: none  Last mammogram: normal Date: 07/08/22 Last pap: normal Date: 07/30/21 OB History: G0, P0   Menstrual History: No LMP recorded. Patient is postmenopausal.    Past Medical History:  Diagnosis Date   Diabetes mellitus (HCC)    Diabetic retinopathy (HCC)    GERD (gastroesophageal reflux disease)    History of shingles    Hyperlipidemia    Hypertension    Seizure (HCC)    Toxic goiter     Past Surgical History:  Procedure Laterality Date   BUNIONECTOMY      COLONOSCOPY  2008   internal hemorrhoids; otherwise normal.  Dr Alethia Huxley   no prior surgery      Family History  Problem Relation Age of Onset   Breast cancer Mother 94   Diabetes Mother    Hypertension Mother    Heart attack Father    Diabetes Father    Stroke Father    Breast cancer Sister    Breast cancer Sister    Breast cancer Sister    Breast cancer Sister    Breast cancer Sister    Skin cancer Brother    Colon cancer Neg Hx    Stomach cancer Neg Hx     Social History:  reports that she quit smoking about 44 years ago. Her smoking use included cigarettes. She started smoking about 51 years ago. She has a 3.5 pack-year smoking history. She has never used smokeless tobacco. She reports that she does not currently use alcohol . She reports that she does not use drugs.  Allergies: No Known Allergies  Medications Prior to Admission  Medication Sig Dispense Refill Last Dose/Taking   amLODipine -benazepril  (LOTREL) 10-20 MG capsule Take 1 capsule by mouth daily. Per patient dose 5-20mg    10/13/2023 at  8:00 AM   Cholecalciferol (VITAMIN D3) 50 MCG (2000 UT) capsule Take 2,000 Units by mouth daily.   Past Week   dapagliflozin propanediol (FARXIGA) 10 MG TABS tablet Take 10 mg by mouth daily at 6 (six) AM.   Past Week   insulin  aspart (NOVOLOG ) 100 UNIT/ML injection For pump   Taking   Insulin  Disposable Pump (OMNIPOD 5 PACK) MISC every 3 (three)  days.   Taking   Multiple Vitamin (MULTIVITAMIN) tablet Take 1 tablet by mouth daily.   Past Week   polyethylene glycol (MIRALAX / GLYCOLAX) 17 g packet Take 17 g by mouth daily.   Past Week   rosuvastatin (CRESTOR) 5 MG tablet Take 5 mg by mouth daily.   10/13/2023 at  8:00 AM   B-D ULTRAFINE III SHORT PEN 31G X 8 MM MISC       ONE TOUCH ULTRA TEST test strip       triamcinolone cream (KENALOG) 0.1 % Apply 1 Application topically daily as needed (Itching). Mix Nystatin cream   Unknown    Review of Systems  Constitutional:  Negative for  fever.  HENT:  Negative for sore throat.   Eyes:  Negative for visual disturbance.  Respiratory:  Negative for shortness of breath.   Cardiovascular:  Negative for chest pain.  Gastrointestinal:  Negative for abdominal pain.  Genitourinary:  Negative for vaginal bleeding.  Musculoskeletal:  Negative for neck pain.  Skin:  Negative for rash.  Neurological:  Negative for headaches.  Psychiatric/Behavioral:  Negative for suicidal ideas.     Blood pressure (!) 131/54, pulse (!) 50, temperature 97.8 F (36.6 C), temperature source Oral, resp. rate 18, height 5\' 6"  (1.676 m), weight 73.5 kg, SpO2 99%. Physical Exam Chaperone Chaperone: present  Constitutional General Appearance: healthy-appearing, well-nourished, well-developed  Psychiatric Orientation: to time, to place, to person Mood and Affect: active and alert, normal mood, normal affect  Abdomen Auscultation/Inspection/Palpation: normal bowel sounds, soft, non-distended, no tenderness, no hepatomegaly, no splenomegaly, no masses, no CVA tenderness Hernia: none palpated  Female Genitalia Vulva: no masses, no atrophy, no lesions Bladder/Urethra: normal meatus, no urethral discharge, no urethral mass, bladder non distended Vagina no tenderness, no erythema, no abnormal vaginal discharge, no vesicle(s) or ulcers, no cystocele, no rectocele Cervix: grossly normal, no discharge, no cervical motion tenderness Uterus: normal size, normal shape, midline, mobile, non-tender, no uterine prolapse Adnexa/Parametria: no parametrial tenderness, no parametrial mass, no adnexal tenderness, no ovarian mass  Results for orders placed or performed during the hospital encounter of 10/13/23 (from the past 24 hours)  Glucose, capillary     Status: Abnormal   Collection Time: 10/13/23  9:45 AM  Result Value Ref Range   Glucose-Capillary 146 (H) 70 - 99 mg/dL  Type and screen     Status: None (Preliminary result)   Collection Time: 10/13/23 10:20  AM  Result Value Ref Range   ABO/RH(D) PENDING    Antibody Screen PENDING    Sample Expiration      10/16/2023,2359 Performed at [facility], [address] [place], [place] [address]   Basic metabolic panel per protocol     Status: Abnormal   Collection Time: 10/13/23 10:20 AM  Result Value Ref Range   Sodium 137 135 - 145 mmol/L   Potassium 3.9 3.5 - 5.1 mmol/L   Chloride 106 98 - 111 mmol/L   CO2 23 22 - 32 mmol/L   Glucose, Bld 126 (H) 70 - 99 mg/dL   BUN 17 8 - 23 mg/dL   Creatinine, Ser 9.52 0.44 - 1.00 mg/dL   Calcium 8.7 (L) 8.9 - 10.3 mg/dL   GFR, Estimated >84 >13 mL/min   Anion gap 8 5 - 15  CBC     Status: Abnormal   Collection Time: 10/13/23 10:20 AM  Result Value Ref Range   WBC 4.1 4.0 - 10.5 K/uL   RBC 3.70 (L) 3.87 -  5.11 MIL/uL   Hemoglobin 11.4 (L) 12.0 - 15.0 g/dL   HCT 21.3 (L) 08.6 - 57.8 %   MCV 97.0 80.0 - 100.0 fL   MCH 30.8 26.0 - 34.0 pg   MCHC 31.8 30.0 - 36.0 g/dL   RDW 46.9 62.9 - 52.8 %   Platelets 168 150 - 400 K/uL   nRBC 0.0 0.0 - 0.2 %  ABO/Rh     Status: None   Collection Time: 10/13/23 10:25 AM  Result Value Ref Range   ABO/RH(D)      O POS Performed at [facility], [address], [place], [place] [address]     No results found.  Assessment/Plan: 58Y G0 with postmenopausal bleeding, thickened endometrium and US  suspicious for endometrial polyp - Plan: Hysteroscopic polypectomy, dilation and curettage with myosure lite - Informed consent obtained. Reviewerd risks including infection, bleeding, uterine perforation, laparoscopy,  failure to complete procedure or achieve desired result. All questions answered.  - T2DM: omnipod and CGM in place   Theodoro Fisherman 10/13/2023, 11:42 AM

## 2023-10-13 NOTE — Anesthesia Preprocedure Evaluation (Signed)
 Anesthesia Evaluation  Patient identified by MRN, date of birth, ID band Patient awake    Reviewed: Allergy & Precautions, H&P , NPO status , Patient's Chart, lab work & pertinent test results  Airway Mallampati: II  TM Distance: >3 FB Neck ROM: Full    Dental no notable dental hx.    Pulmonary neg pulmonary ROS, former smoker   Pulmonary exam normal breath sounds clear to auscultation       Cardiovascular hypertension, Pt. on medications negative cardio ROS Normal cardiovascular exam Rhythm:Regular Rate:Normal     Neuro/Psych negative neurological ROS  negative psych ROS   GI/Hepatic Neg liver ROS,GERD  ,,  Endo/Other  negative endocrine ROSdiabetes    Renal/GU negative Renal ROS  negative genitourinary   Musculoskeletal negative musculoskeletal ROS (+)    Abdominal   Peds negative pediatric ROS (+)  Hematology negative hematology ROS (+)   Anesthesia Other Findings   Reproductive/Obstetrics negative OB ROS                             Anesthesia Physical Anesthesia Plan  ASA: 3  Anesthesia Plan: General   Post-op Pain Management: Tylenol  PO (pre-op)*   Induction: Intravenous  PONV Risk Score and Plan: 3 and Ondansetron , Dexamethasone, Midazolam  and Treatment may vary due to age or medical condition  Airway Management Planned: LMA  Additional Equipment:   Intra-op Plan:   Post-operative Plan: Extubation in OR  Informed Consent: I have reviewed the patients History and Physical, chart, labs and discussed the procedure including the risks, benefits and alternatives for the proposed anesthesia with the patient or authorized representative who has indicated his/her understanding and acceptance.     Dental advisory given  Plan Discussed with: CRNA  Anesthesia Plan Comments:        Anesthesia Quick Evaluation

## 2023-10-13 NOTE — Anesthesia Procedure Notes (Signed)
 Procedure Name: LMA Insertion Date/Time: 10/13/2023 12:20 PM  Performed by: [address], Betsie Peckman A, CRNAPre-anesthesia Checklist: Patient identified, Emergency Drugs available, Suction available, Patient being monitored and Timeout performed Patient Re-evaluated:Patient Re-evaluated prior to induction Oxygen Delivery Method: Circle system utilized Preoxygenation: Pre-oxygenation with 100% oxygen Induction Type: IV induction LMA: LMA inserted LMA Size: 4.0 Number of attempts: 1 Tube secured with: Tape

## 2023-10-13 NOTE — Op Note (Addendum)
 10/13/2023   12:50 PM   PATIENT:  Amy Luna  68 y.o. female   PRE-OPERATIVE DIAGNOSIS:  postmenopausal bleeding, suspected endometrial polyp   POST-OPERATIVE DIAGNOSIS:  postmenopausal bleeding, endometrial polyp   PROCEDURE:  Procedure(s) with comments: DILATATION & CURETTAGE/HYSTEROSCOPY WITH RESECTOCOPE (N/A) - with myosure   SURGEON:  Surgeons and Role:    * Theodoro Fisherman, MD - Primary   ANESTHESIA:   local and general   EBL:  5 mL    BLOOD ADMINISTERED:none   DRAINS: none    LOCAL MEDICATIONS USED:  LIDOCAINE   and Amount: 10 ml   SPECIMEN:  Source of Specimen:  endometrial polyp and endometrial curettings   DISPOSITION OF SPECIMEN:  PATHOLOGY   COUNTS:  YES   PATIENT DISPOSITION:  PACU - hemodynamically stable.  FINDINGS: anteverted uterus sounded to 7cm. On hysterscopic view, bilateral ostia visualized. Endometrial polyp noted with stalk at mid right anterior uterine wall. Otherwise thin appearing endometrium. Total fluid deficit .  COMPLICATIONS: none  PROCEDURE IN DETAIL:  The patient was appropriately consented and taken to the operating room where anesthesia was administered without difficulty. Thromboguards were placed and connected. She was placed in the dorsal lithotomy position in stirrups. She was examined under anesthesia and found to have an anteverted uterus. The patient was then prepped and draped in normal sterile fashion. A long speculum was inserted into the vagina. A single-tooth tenaculum was used to grasp the anterior lip of the cervix. A paracervical block was obtained by injecting a total of 10mL of 1% lidocaine  at the 4 and 8 o'clock positions at the cervicovaginal junction. The uterus was sounded to 7cm. The cervical os was sequentially dilated using Pratt dilators to accommodate the hysteroscope. The hysteroscope was introduced under direct visualization and the uterus was distended with normal saline. Bilateral ostia were  visualized. Findings as noted above. The Myosure Lite device was used to resect the polyp. Specimen was collected for pathology. Slight oozing was noted in the uterine cavity from the bed of the resected polyp. The hysteroscope was removed.  Tranexamic acid  1g was administered for bleeding prophylaxis. I observed the external cervical os for a few minutes without any increased bleeding from the cervix. Uterine fundus massaged without any increased bleeding. The tenaculum was removed from the cervix and good hemostasis was noted at the puncture sites. The patient tolerated the procedure well. The instrument and sponge counts were correct times two. The patient was awakened from anesthesia and taken to the recovery room in stable condition.  Zenia Hight, MD 10/13/23 12:54 PM

## 2023-10-13 NOTE — Brief Op Note (Signed)
 10/13/2023  12:50 PM  PATIENT:  Amy Luna  68 y.o. female  PRE-OPERATIVE DIAGNOSIS:  postmenopausal bleeding, suspected endometrial polyp  POST-OPERATIVE DIAGNOSIS:  postmenopausal bleeding, endometrial polyp  PROCEDURE:  Procedure(s) with comments: DILATATION & CURETTAGE/HYSTEROSCOPY WITH RESECTOCOPE (N/A) - with myosure  SURGEON:  Surgeons and Role:    * Theodoro Fisherman, MD - Primary  ANESTHESIA:   local and general  EBL:  5 mL   BLOOD ADMINISTERED:none  DRAINS: none   LOCAL MEDICATIONS USED:  LIDOCAINE   and Amount: 10 ml  SPECIMEN:  Source of Specimen:  endometrial polyp  DISPOSITION OF SPECIMEN:  PATHOLOGY  COUNTS:  YES  TOURNIQUET:  * No tourniquets in log *  DICTATION: .Note written in EPIC  PLAN OF CARE: Discharge to home after PACU  PATIENT DISPOSITION:  PACU - hemodynamically stable.   Delay start of Pharmacological VTE agent (>24hrs) due to surgical blood loss or risk of bleeding: no

## 2023-10-14 ENCOUNTER — Encounter (HOSPITAL_COMMUNITY): Payer: Self-pay | Admitting: Obstetrics and Gynecology

## 2023-10-14 LAB — SURGICAL PATHOLOGY

## 2023-10-16 DIAGNOSIS — E113311 Type 2 diabetes mellitus with moderate nonproliferative diabetic retinopathy with macular edema, right eye: Secondary | ICD-10-CM | POA: Diagnosis not present

## 2023-10-16 DIAGNOSIS — E139 Other specified diabetes mellitus without complications: Secondary | ICD-10-CM | POA: Diagnosis not present

## 2023-10-16 DIAGNOSIS — Z794 Long term (current) use of insulin: Secondary | ICD-10-CM | POA: Diagnosis not present

## 2023-10-16 DIAGNOSIS — E113392 Type 2 diabetes mellitus with moderate nonproliferative diabetic retinopathy without macular edema, left eye: Secondary | ICD-10-CM | POA: Diagnosis not present

## 2023-11-15 DIAGNOSIS — E1165 Type 2 diabetes mellitus with hyperglycemia: Secondary | ICD-10-CM | POA: Diagnosis not present

## 2023-11-15 DIAGNOSIS — Z794 Long term (current) use of insulin: Secondary | ICD-10-CM | POA: Diagnosis not present

## 2023-11-15 DIAGNOSIS — R42 Dizziness and giddiness: Secondary | ICD-10-CM | POA: Diagnosis not present

## 2023-11-15 DIAGNOSIS — Z79899 Other long term (current) drug therapy: Secondary | ICD-10-CM | POA: Diagnosis not present

## 2023-11-15 DIAGNOSIS — E108 Type 1 diabetes mellitus with unspecified complications: Secondary | ICD-10-CM | POA: Diagnosis not present

## 2023-11-16 DIAGNOSIS — E139 Other specified diabetes mellitus without complications: Secondary | ICD-10-CM | POA: Diagnosis not present

## 2023-12-16 DIAGNOSIS — E7800 Pure hypercholesterolemia, unspecified: Secondary | ICD-10-CM | POA: Diagnosis not present

## 2023-12-16 DIAGNOSIS — E559 Vitamin D deficiency, unspecified: Secondary | ICD-10-CM | POA: Diagnosis not present

## 2023-12-16 DIAGNOSIS — I10 Essential (primary) hypertension: Secondary | ICD-10-CM | POA: Diagnosis not present

## 2023-12-16 DIAGNOSIS — E139 Other specified diabetes mellitus without complications: Secondary | ICD-10-CM | POA: Diagnosis not present

## 2023-12-16 DIAGNOSIS — Z0000 Encounter for general adult medical examination without abnormal findings: Secondary | ICD-10-CM | POA: Diagnosis not present

## 2023-12-16 DIAGNOSIS — E1165 Type 2 diabetes mellitus with hyperglycemia: Secondary | ICD-10-CM | POA: Diagnosis not present

## 2023-12-16 LAB — LAB REPORT - SCANNED: EGFR: 71

## 2023-12-23 DIAGNOSIS — Z0000 Encounter for general adult medical examination without abnormal findings: Secondary | ICD-10-CM | POA: Diagnosis not present

## 2023-12-23 DIAGNOSIS — E1165 Type 2 diabetes mellitus with hyperglycemia: Secondary | ICD-10-CM | POA: Diagnosis not present

## 2023-12-23 DIAGNOSIS — I700 Atherosclerosis of aorta: Secondary | ICD-10-CM | POA: Diagnosis not present

## 2023-12-23 DIAGNOSIS — I2510 Atherosclerotic heart disease of native coronary artery without angina pectoris: Secondary | ICD-10-CM | POA: Diagnosis not present

## 2023-12-23 DIAGNOSIS — I10 Essential (primary) hypertension: Secondary | ICD-10-CM | POA: Diagnosis not present

## 2023-12-23 DIAGNOSIS — E559 Vitamin D deficiency, unspecified: Secondary | ICD-10-CM | POA: Diagnosis not present

## 2023-12-24 ENCOUNTER — Ambulatory Visit: Payer: Self-pay | Admitting: Cardiology

## 2024-01-16 DIAGNOSIS — E139 Other specified diabetes mellitus without complications: Secondary | ICD-10-CM | POA: Diagnosis not present

## 2024-01-19 DIAGNOSIS — E11311 Type 2 diabetes mellitus with unspecified diabetic retinopathy with macular edema: Secondary | ICD-10-CM | POA: Diagnosis not present

## 2024-01-19 DIAGNOSIS — E785 Hyperlipidemia, unspecified: Secondary | ICD-10-CM | POA: Diagnosis not present

## 2024-01-19 DIAGNOSIS — I700 Atherosclerosis of aorta: Secondary | ICD-10-CM | POA: Diagnosis not present

## 2024-01-19 DIAGNOSIS — Z833 Family history of diabetes mellitus: Secondary | ICD-10-CM | POA: Diagnosis not present

## 2024-01-19 DIAGNOSIS — N1832 Chronic kidney disease, stage 3b: Secondary | ICD-10-CM | POA: Diagnosis not present

## 2024-01-19 DIAGNOSIS — Z794 Long term (current) use of insulin: Secondary | ICD-10-CM | POA: Diagnosis not present

## 2024-01-19 DIAGNOSIS — I2510 Atherosclerotic heart disease of native coronary artery without angina pectoris: Secondary | ICD-10-CM | POA: Diagnosis not present

## 2024-01-19 DIAGNOSIS — E1122 Type 2 diabetes mellitus with diabetic chronic kidney disease: Secondary | ICD-10-CM | POA: Diagnosis not present

## 2024-01-19 DIAGNOSIS — I129 Hypertensive chronic kidney disease with stage 1 through stage 4 chronic kidney disease, or unspecified chronic kidney disease: Secondary | ICD-10-CM | POA: Diagnosis not present

## 2024-02-16 DIAGNOSIS — E139 Other specified diabetes mellitus without complications: Secondary | ICD-10-CM | POA: Diagnosis not present

## 2024-03-16 DIAGNOSIS — I10 Essential (primary) hypertension: Secondary | ICD-10-CM | POA: Diagnosis not present

## 2024-03-16 DIAGNOSIS — Z794 Long term (current) use of insulin: Secondary | ICD-10-CM | POA: Diagnosis not present

## 2024-03-16 DIAGNOSIS — E139 Other specified diabetes mellitus without complications: Secondary | ICD-10-CM | POA: Diagnosis not present

## 2024-03-16 DIAGNOSIS — E559 Vitamin D deficiency, unspecified: Secondary | ICD-10-CM | POA: Diagnosis not present

## 2024-03-16 DIAGNOSIS — I2510 Atherosclerotic heart disease of native coronary artery without angina pectoris: Secondary | ICD-10-CM | POA: Diagnosis not present

## 2024-03-16 DIAGNOSIS — E7800 Pure hypercholesterolemia, unspecified: Secondary | ICD-10-CM | POA: Diagnosis not present

## 2024-03-17 DIAGNOSIS — E139 Other specified diabetes mellitus without complications: Secondary | ICD-10-CM | POA: Diagnosis not present

## 2024-04-17 DIAGNOSIS — E139 Other specified diabetes mellitus without complications: Secondary | ICD-10-CM | POA: Diagnosis not present

## 2024-06-25 ENCOUNTER — Other Ambulatory Visit: Payer: Self-pay | Admitting: Internal Medicine

## 2024-06-25 DIAGNOSIS — Z1231 Encounter for screening mammogram for malignant neoplasm of breast: Secondary | ICD-10-CM

## 2024-07-27 ENCOUNTER — Ambulatory Visit
